# Patient Record
Sex: Female | Born: 1990 | Race: Black or African American | Hispanic: No | Marital: Single | Smoking: Former smoker
Health system: Southern US, Community
[De-identification: ages and names within clinical notes are randomized; demographics above are authoritative.]

## PROBLEM LIST (undated history)

## (undated) ENCOUNTER — Inpatient Hospital Stay (HOSPITAL_COMMUNITY): Payer: Self-pay

## (undated) DIAGNOSIS — D649 Anemia, unspecified: Secondary | ICD-10-CM

## (undated) DIAGNOSIS — F329 Major depressive disorder, single episode, unspecified: Secondary | ICD-10-CM

## (undated) DIAGNOSIS — F32A Depression, unspecified: Secondary | ICD-10-CM

## (undated) DIAGNOSIS — O139 Gestational [pregnancy-induced] hypertension without significant proteinuria, unspecified trimester: Secondary | ICD-10-CM

## (undated) DIAGNOSIS — F419 Anxiety disorder, unspecified: Secondary | ICD-10-CM

## (undated) HISTORY — DX: Anxiety disorder, unspecified: F41.9

## (undated) HISTORY — DX: Depression, unspecified: F32.A

## (undated) HISTORY — DX: Major depressive disorder, single episode, unspecified: F32.9

## (undated) HISTORY — PX: WISDOM TOOTH EXTRACTION: SHX21

---
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
## 2009-04-21 ENCOUNTER — Emergency Department (HOSPITAL_COMMUNITY): Admission: EM | Admit: 2009-04-21 | Discharge: 2009-04-21 | Payer: Self-pay | Admitting: Emergency Medicine

## 2009-04-22 ENCOUNTER — Emergency Department (HOSPITAL_COMMUNITY): Admission: EM | Admit: 2009-04-22 | Discharge: 2009-04-23 | Payer: Self-pay | Admitting: Emergency Medicine

## 2009-07-07 ENCOUNTER — Emergency Department (HOSPITAL_COMMUNITY): Admission: EM | Admit: 2009-07-07 | Discharge: 2009-07-07 | Payer: Self-pay | Admitting: Emergency Medicine

## 2009-07-10 ENCOUNTER — Other Ambulatory Visit (HOSPITAL_COMMUNITY): Admission: RE | Admit: 2009-07-10 | Discharge: 2009-07-16 | Payer: Self-pay | Admitting: Psychiatry

## 2009-07-10 ENCOUNTER — Ambulatory Visit: Payer: Self-pay | Admitting: Psychiatry

## 2010-09-19 ENCOUNTER — Emergency Department (HOSPITAL_COMMUNITY)
Admission: EM | Admit: 2010-09-19 | Discharge: 2010-09-19 | Disposition: A | Discharge status: Home / Self Care | Payer: Self-pay | Attending: Emergency Medicine | Admitting: Emergency Medicine

## 2010-09-19 ENCOUNTER — Emergency Department (HOSPITAL_COMMUNITY): Payer: Self-pay

## 2010-09-19 DIAGNOSIS — R071 Chest pain on breathing: Secondary | ICD-10-CM | POA: Insufficient documentation

## 2010-09-19 DIAGNOSIS — R0602 Shortness of breath: Secondary | ICD-10-CM | POA: Insufficient documentation

## 2010-09-19 DIAGNOSIS — N644 Mastodynia: Secondary | ICD-10-CM | POA: Insufficient documentation

## 2010-09-19 LAB — DIFFERENTIAL
Eosinophils Absolute: 0.1 10*3/uL (ref 0.0–0.7)
Eosinophils Relative: 1 % (ref 0–5)
Lymphocytes Relative: 26 % (ref 12–46)
Lymphs Abs: 1.7 10*3/uL (ref 0.7–4.0)
Monocytes Relative: 5 % (ref 3–12)

## 2010-09-19 LAB — CBC
HCT: 35.8 % — ABNORMAL LOW (ref 36.0–46.0)
MCH: 31.4 pg (ref 26.0–34.0)
MCV: 91.3 fL (ref 78.0–100.0)
Platelets: 200 10*3/uL (ref 150–400)
RBC: 3.92 MIL/uL (ref 3.87–5.11)
RDW: 12.8 % (ref 11.5–15.5)
WBC: 6.4 10*3/uL (ref 4.0–10.5)

## 2010-09-19 LAB — POCT I-STAT, CHEM 8
BUN: 7 mg/dL (ref 6–23)
Calcium, Ion: 1.26 mmol/L (ref 1.12–1.32)
Chloride: 103 mEq/L (ref 96–112)
Glucose, Bld: 87 mg/dL (ref 70–99)
TCO2: 22 mmol/L (ref 0–100)

## 2010-10-23 ENCOUNTER — Inpatient Hospital Stay (HOSPITAL_COMMUNITY)
Admission: RE | Admit: 2010-10-23 | Discharge: 2010-10-23 | Disposition: A | Discharge status: Home / Self Care | Payer: Self-pay | Source: Ambulatory Visit | Attending: Family Medicine | Admitting: Family Medicine

## 2010-10-24 ENCOUNTER — Encounter (HOSPITAL_COMMUNITY): Payer: Self-pay | Admitting: Radiology

## 2010-10-24 ENCOUNTER — Inpatient Hospital Stay (HOSPITAL_COMMUNITY)
Admission: AD | Admit: 2010-10-24 | Discharge: 2010-10-24 | Disposition: A | Discharge status: Home / Self Care | Payer: Medicaid Other | Source: Ambulatory Visit | Attending: Obstetrics & Gynecology | Admitting: Obstetrics & Gynecology

## 2010-10-24 ENCOUNTER — Inpatient Hospital Stay (HOSPITAL_COMMUNITY): Payer: Medicaid Other

## 2010-10-24 DIAGNOSIS — O209 Hemorrhage in early pregnancy, unspecified: Secondary | ICD-10-CM | POA: Insufficient documentation

## 2010-10-24 LAB — CBC
Platelets: 203 10*3/uL (ref 150–400)
RBC: 3.55 MIL/uL — ABNORMAL LOW (ref 3.87–5.11)
RDW: 13 % (ref 11.5–15.5)
WBC: 6.4 10*3/uL (ref 4.0–10.5)

## 2010-10-24 LAB — URINALYSIS, ROUTINE W REFLEX MICROSCOPIC
Bilirubin Urine: NEGATIVE
Glucose, UA: NEGATIVE mg/dL
Hgb urine dipstick: NEGATIVE
Specific Gravity, Urine: >1.03 — ABNORMAL HIGH (ref 1.005–1.030)

## 2010-10-24 LAB — ABO/RH: ABO/RH(D): A POS

## 2010-10-24 LAB — WET PREP, GENITAL
Clue Cells Wet Prep HPF POC: NONE SEEN
Trich, Wet Prep: NONE SEEN
Yeast Wet Prep HPF POC: NONE SEEN

## 2010-10-24 LAB — HCG, QUANTITATIVE, PREGNANCY

## 2010-10-25 LAB — GC/CHLAMYDIA PROBE AMP, GENITAL

## 2010-10-28 ENCOUNTER — Inpatient Hospital Stay (HOSPITAL_COMMUNITY): Payer: Medicaid Other

## 2010-10-28 ENCOUNTER — Inpatient Hospital Stay (HOSPITAL_COMMUNITY)
Admission: AD | Admit: 2010-10-28 | Discharge: 2010-10-28 | Disposition: A | Discharge status: Home / Self Care | Payer: Medicaid Other | Source: Ambulatory Visit | Attending: Obstetrics & Gynecology | Admitting: Obstetrics & Gynecology

## 2010-10-28 DIAGNOSIS — O209 Hemorrhage in early pregnancy, unspecified: Secondary | ICD-10-CM

## 2010-10-28 LAB — URINALYSIS, ROUTINE W REFLEX MICROSCOPIC
Bilirubin Urine: NEGATIVE
Hgb urine dipstick: NEGATIVE
Ketones, ur: NEGATIVE mg/dL
Protein, ur: NEGATIVE mg/dL
Specific Gravity, Urine: 1.02 (ref 1.005–1.030)
Urobilinogen, UA: 0.2 mg/dL (ref 0.0–1.0)

## 2010-10-28 LAB — RAPID URINE DRUG SCREEN, HOSP PERFORMED: Tetrahydrocannabinol: NOT DETECTED

## 2010-11-04 LAB — COMPREHENSIVE METABOLIC PANEL
ALT: 8 U/L (ref 0–35)
AST: 17 U/L (ref 0–37)
Albumin: 4.2 g/dL (ref 3.5–5.2)
CO2: 25 mEq/L (ref 19–32)
Chloride: 104 mEq/L (ref 96–112)
GFR calc Af Amer: >60 mL/min (ref >60)
GFR calc non Af Amer: >60 mL/min (ref >60)
Sodium: 137 mEq/L (ref 135–145)
Total Bilirubin: 1.2 mg/dL (ref 0.3–1.2)

## 2010-11-04 LAB — ACETAMINOPHEN LEVEL
Acetaminophen (Tylenol), Serum: <10 ug/mL — ABNORMAL LOW (ref 10–30)
Acetaminophen (Tylenol), Serum: <10 ug/mL — ABNORMAL LOW (ref 10–30)

## 2010-11-04 LAB — URINALYSIS, ROUTINE W REFLEX MICROSCOPIC
Glucose, UA: NEGATIVE mg/dL
Ketones, ur: NEGATIVE mg/dL
Leukocytes, UA: NEGATIVE
Nitrite: NEGATIVE
Specific Gravity, Urine: 1.036 — ABNORMAL HIGH (ref 1.005–1.030)
pH: 6 (ref 5.0–8.0)

## 2010-11-04 LAB — RAPID URINE DRUG SCREEN, HOSP PERFORMED
Cocaine: NOT DETECTED
Tetrahydrocannabinol: POSITIVE — AB

## 2010-11-04 LAB — URINE MICROSCOPIC-ADD ON

## 2010-11-04 LAB — DIFFERENTIAL
Basophils Absolute: 0.1 10*3/uL (ref 0.0–0.1)
Eosinophils Absolute: 0.1 10*3/uL (ref 0.0–0.7)
Eosinophils Relative: 1 % (ref 0–5)
Lymphs Abs: 1.9 10*3/uL (ref 0.7–4.0)
Monocytes Absolute: 0.2 10*3/uL (ref 0.1–1.0)

## 2010-11-04 LAB — CBC
RBC: 4.18 MIL/uL (ref 3.87–5.11)
WBC: 3.9 10*3/uL — ABNORMAL LOW (ref 4.0–10.5)

## 2010-11-04 LAB — PROTIME-INR: Prothrombin Time: 14.4 seconds (ref 11.6–15.2)

## 2010-11-04 LAB — ETHANOL: Alcohol, Ethyl (B): <5 mg/dL (ref 0–10)

## 2010-11-07 LAB — HEPATITIS PANEL, ACUTE: Hepatitis B Surface Ag: NEGATIVE

## 2010-11-07 LAB — URINALYSIS, ROUTINE W REFLEX MICROSCOPIC
Hgb urine dipstick: NEGATIVE
Leukocytes, UA: NEGATIVE
Nitrite: NEGATIVE
Protein, ur: 30 mg/dL — AB
Protein, ur: NEGATIVE mg/dL
Specific Gravity, Urine: 1.025 (ref 1.005–1.030)
Specific Gravity, Urine: 1.031 — ABNORMAL HIGH (ref 1.005–1.030)
Urobilinogen, UA: 0.2 mg/dL (ref 0.0–1.0)
Urobilinogen, UA: 1 mg/dL (ref 0.0–1.0)

## 2010-11-07 LAB — COMPREHENSIVE METABOLIC PANEL
ALT: 13 U/L (ref 0–35)
AST: 27 U/L (ref 0–37)
Albumin: 4.1 g/dL (ref 3.5–5.2)
Albumin: 4.6 g/dL (ref 3.5–5.2)
Alkaline Phosphatase: 41 U/L (ref 39–117)
Alkaline Phosphatase: 47 U/L (ref 39–117)
BUN: 12 mg/dL (ref 6–23)
CO2: 24 mEq/L (ref 19–32)
Chloride: 103 mEq/L (ref 96–112)
Creatinine, Ser: 0.67 mg/dL (ref 0.4–1.2)
GFR calc Af Amer: >60 mL/min (ref >60)
Glucose, Bld: 124 mg/dL — ABNORMAL HIGH (ref 70–99)
Potassium: 3.5 mEq/L (ref 3.5–5.1)
Potassium: 4 mEq/L (ref 3.5–5.1)
Sodium: 138 mEq/L (ref 135–145)
Total Bilirubin: 1.3 mg/dL — ABNORMAL HIGH (ref 0.3–1.2)
Total Bilirubin: 1.7 mg/dL — ABNORMAL HIGH (ref 0.3–1.2)
Total Protein: 8.3 g/dL (ref 6.0–8.3)

## 2010-11-07 LAB — DIFFERENTIAL
Basophils Absolute: 0 10*3/uL (ref 0.0–0.1)
Basophils Absolute: 0 10*3/uL (ref 0.0–0.1)
Basophils Relative: 0 % (ref 0–1)
Lymphocytes Relative: 11 % — ABNORMAL LOW (ref 12–46)
Lymphs Abs: 0.2 10*3/uL — ABNORMAL LOW (ref 0.7–4.0)
Monocytes Absolute: 0.6 10*3/uL (ref 0.1–1.0)
Monocytes Relative: 2 % — ABNORMAL LOW (ref 3–12)
Monocytes Relative: 3 % (ref 3–12)
Neutro Abs: 19.3 10*3/uL — ABNORMAL HIGH (ref 1.7–7.7)
Neutro Abs: 7.3 10*3/uL (ref 1.7–7.7)
Neutrophils Relative %: 87 % — ABNORMAL HIGH (ref 43–77)

## 2010-11-07 LAB — PREGNANCY, URINE: Preg Test, Ur: NEGATIVE

## 2010-11-07 LAB — URINE MICROSCOPIC-ADD ON

## 2010-11-07 LAB — CBC
HCT: 40.2 % (ref 36.0–46.0)
Hemoglobin: 13.8 g/dL (ref 12.0–15.0)
MCV: 92.5 fL (ref 78.0–100.0)
Platelets: 146 10*3/uL — ABNORMAL LOW (ref 150–400)
Platelets: 210 10*3/uL (ref 150–400)
RBC: 4.27 MIL/uL (ref 3.87–5.11)
RDW: 13 % (ref 11.5–15.5)
WBC: 20.1 10*3/uL — ABNORMAL HIGH (ref 4.0–10.5)

## 2010-11-13 LAB — POCT I-STAT, CHEM 8
BUN: 11 mg/dL (ref 6–23)
Creatinine, Ser: 0.9 mg/dL (ref 0.4–1.2)
Potassium: 3.5 mEq/L (ref 3.5–5.1)
Sodium: 140 mEq/L (ref 135–145)

## 2010-11-18 LAB — COMPREHENSIVE METABOLIC PANEL
ALT: 12 U/L (ref 0–35)
CO2: 25 mEq/L (ref 19–32)
Calcium: 9.9 mg/dL (ref 8.4–10.5)
Chloride: 102 mEq/L (ref 96–112)
Creatinine, Ser: 0.71 mg/dL (ref 0.4–1.2)
GFR calc non Af Amer: >60 mL/min (ref >60)
Glucose, Bld: 79 mg/dL (ref 70–99)
Sodium: 134 mEq/L — ABNORMAL LOW (ref 135–145)
Total Bilirubin: 1 mg/dL (ref 0.3–1.2)

## 2010-11-18 LAB — URINALYSIS, ROUTINE W REFLEX MICROSCOPIC
Bilirubin Urine: NEGATIVE
Ketones, ur: 15 mg/dL — AB
Nitrite: NEGATIVE
Protein, ur: NEGATIVE mg/dL
Urobilinogen, UA: 1 mg/dL (ref 0.0–1.0)
pH: 6 (ref 5.0–8.0)

## 2010-11-18 LAB — DIFFERENTIAL
Basophils Absolute: 0 10*3/uL (ref 0.0–0.1)
Eosinophils Absolute: 0 10*3/uL (ref 0.0–0.7)
Lymphs Abs: 1.9 10*3/uL (ref 0.7–4.0)
Neutrophils Relative %: 66 % (ref 43–77)

## 2010-11-18 LAB — WET PREP, GENITAL: WBC, Wet Prep HPF POC: NONE SEEN

## 2010-11-18 LAB — CBC
Hemoglobin: 14.5 g/dL (ref 12.0–15.0)
MCHC: 33.5 g/dL (ref 30.0–36.0)
MCV: 92.8 fL (ref 78.0–100.0)
RBC: 4.66 MIL/uL (ref 3.87–5.11)

## 2010-11-18 LAB — RPR: RPR Ser Ql: NONREACTIVE

## 2010-11-18 LAB — PREGNANCY, URINE: Preg Test, Ur: NEGATIVE

## 2011-02-02 ENCOUNTER — Inpatient Hospital Stay (HOSPITAL_COMMUNITY)
Admission: AD | Admit: 2011-02-02 | Discharge: 2011-02-02 | Disposition: A | Discharge status: Home / Self Care | Payer: Medicaid Other | Source: Ambulatory Visit | Attending: Obstetrics | Admitting: Obstetrics

## 2011-02-02 DIAGNOSIS — O4700 False labor before 37 completed weeks of gestation, unspecified trimester: Secondary | ICD-10-CM | POA: Insufficient documentation

## 2011-02-02 LAB — URINALYSIS, ROUTINE W REFLEX MICROSCOPIC
Leukocytes, UA: NEGATIVE
Nitrite: NEGATIVE
Specific Gravity, Urine: 1.02 (ref 1.005–1.030)
Urobilinogen, UA: 1 mg/dL (ref 0.0–1.0)
pH: 7.5 (ref 5.0–8.0)

## 2011-02-05 LAB — RUBELLA ANTIBODY, IGM: Rubella: IMMUNE

## 2011-02-05 LAB — ABO/RH: RH Type: POSITIVE

## 2011-02-05 LAB — HIV ANTIBODY (ROUTINE TESTING W REFLEX): HIV: NONREACTIVE

## 2011-02-26 ENCOUNTER — Inpatient Hospital Stay (HOSPITAL_COMMUNITY)
Admission: AD | Admit: 2011-02-26 | Discharge: 2011-02-26 | Disposition: A | Discharge status: Home / Self Care | Payer: Medicaid Other | Source: Ambulatory Visit | Attending: Obstetrics | Admitting: Obstetrics

## 2011-02-26 ENCOUNTER — Encounter (HOSPITAL_COMMUNITY): Payer: Self-pay

## 2011-02-26 DIAGNOSIS — O479 False labor, unspecified: Secondary | ICD-10-CM | POA: Insufficient documentation

## 2011-02-26 NOTE — Progress Notes (Signed)
Pt states she is having contractions every 5-6 minutes. Reports good fetal movement, no leaking or bleeding.

## 2011-02-26 NOTE — Progress Notes (Signed)
uc's since 2300, denies bleeding or LOF.  Pt was seen in MD office yesterday but her cervix was not checked.

## 2011-02-28 ENCOUNTER — Inpatient Hospital Stay (HOSPITAL_COMMUNITY)
Admission: AD | Admit: 2011-02-28 | Discharge: 2011-03-01 | Disposition: A | Discharge status: Home / Self Care | Payer: Medicaid Other | Source: Ambulatory Visit | Attending: Obstetrics & Gynecology | Admitting: Obstetrics & Gynecology

## 2011-02-28 ENCOUNTER — Encounter (HOSPITAL_COMMUNITY): Payer: Self-pay

## 2011-02-28 DIAGNOSIS — B9689 Other specified bacterial agents as the cause of diseases classified elsewhere: Secondary | ICD-10-CM | POA: Insufficient documentation

## 2011-02-28 DIAGNOSIS — Z3400 Encounter for supervision of normal first pregnancy, unspecified trimester: Secondary | ICD-10-CM

## 2011-02-28 DIAGNOSIS — N760 Acute vaginitis: Secondary | ICD-10-CM | POA: Insufficient documentation

## 2011-02-28 DIAGNOSIS — O2390 Unspecified genitourinary tract infection in pregnancy, unspecified trimester: Secondary | ICD-10-CM | POA: Insufficient documentation

## 2011-02-28 DIAGNOSIS — A499 Bacterial infection, unspecified: Secondary | ICD-10-CM | POA: Insufficient documentation

## 2011-02-28 DIAGNOSIS — O479 False labor, unspecified: Secondary | ICD-10-CM

## 2011-02-28 NOTE — Progress Notes (Signed)
Patient is here with c/o leaking amniotic fluids for 2 days. She states the fluids has an odor today and she had to wear a pantiliner. She reports good fetal movement and lower abdominal pressure. Denies any vaginal bleeding or contractions.

## 2011-02-28 NOTE — Progress Notes (Signed)
Pt states, " For the past two days I have had trickles for water coming out and pressure at the bottom. It is enough that it comes thru my clothes."

## 2011-03-01 LAB — WET PREP, GENITAL: Trich, Wet Prep: NONE SEEN

## 2011-03-01 MED ORDER — METRONIDAZOLE 500 MG PO TABS: 500.0000 mg | ORAL_TABLET | Freq: Two times a day (BID) | ORAL | Status: AC

## 2011-03-01 NOTE — ED Provider Notes (Signed)
Amnisure negative Microscopic wet-mount exam shows clue cells, white blood cells  Assessment: 1. Intact membranes 2. BV  Plan: 1. D/C home 2. Rx Flagyl 500 mg PO BID x 7 days

## 2011-03-01 NOTE — ED Provider Notes (Signed)
Cheyenne Hahn is a 20 y.o. female at 38.4 weeks presenting for possible LOF x 2 days. She reports several episodes of feeling dampness in her underwear, lately enough to wear a panty-liner. She describes the discharge as watery, but cannot tell if it is clear or white. She reports + FM and denies VB or significant contractions. History OB History    Grav Para Term Preterm Abortions TAB SAB Ect Mult Living   1              Past Medical History  Diagnosis Date  . No pertinent past medical history    Past Surgical History  Procedure Date  . Wisdom tooth extraction    Family History: family history is not on file. Social History:  reports that she has never smoked. She does not have any smokeless tobacco history on file. She reports that she does not drink alcohol or use illicit drugs.  ROS  Dilation: 1 Effacement (%): 50 Station: -2 Blood pressure 113/77, pulse 101, temperature 98.9 F (37.2 C), temperature source Oral, resp. rate 18, height 5' 2.25" (1.581 m), weight 65.488 kg (144 lb 6 oz).  Maternal Exam:  Introitus: Vagina is positive for vaginal discharge (Mod amount of thin, white, malodorous discharge).    Physical Exam  Constitutional: She is oriented to person, place, and time. She appears well-developed and well-nourished. No distress.  GI: Soft. There is no tenderness.  Genitourinary: Uterus normal. Uterus is not tender. No tenderness or bleeding around the vagina. Vaginal discharge (Mod amount of thin, white, malodorous discharge) found.  Neurological: She is alert and oriented to person, place, and time.   FHR: 130's category I  Toco: irreg, mild UC's Prenatal labs: ABO, Rh: A POS (03/23 1900) Antibody: Negative (07/05 0000) Rubella:   RPR:    HBsAg: Negative (07/05 0000)  HIV: Non-reactive (07/05 0000)  GBS: Positive (07/06 0000)   Assessment/Plan: Assessment: 1. Possible LOF 2. FHR category I 3. Fern neg  Plan: 1. Send amnisure per consult w/ Dr.  Tamela Oddi 2. Wet prep 3. D/C home if [facility], Undrea Shipes 03/01/2011, 1:30 AM

## 2011-03-11 ENCOUNTER — Other Ambulatory Visit (HOSPITAL_COMMUNITY): Payer: Self-pay | Admitting: Obstetrics

## 2011-03-11 ENCOUNTER — Inpatient Hospital Stay (HOSPITAL_COMMUNITY)
Admission: AD | Admit: 2011-03-11 | Discharge: 2011-03-14 | DRG: 775 | Disposition: A | Discharge status: Home / Self Care | Payer: Medicaid Other | Source: Ambulatory Visit | Attending: Obstetrics | Admitting: Obstetrics

## 2011-03-11 DIAGNOSIS — Z22330 Carrier of Group B streptococcus: Secondary | ICD-10-CM

## 2011-03-11 DIAGNOSIS — O99892 Other specified diseases and conditions complicating childbirth: Secondary | ICD-10-CM | POA: Diagnosis present

## 2011-03-11 DIAGNOSIS — Z3490 Encounter for supervision of normal pregnancy, unspecified, unspecified trimester: Secondary | ICD-10-CM

## 2011-03-11 DIAGNOSIS — O368190 Decreased fetal movements, unspecified trimester, not applicable or unspecified: Secondary | ICD-10-CM

## 2011-03-11 LAB — CBC
MCHC: 33.1 g/dL (ref 30.0–36.0)
Platelets: 175 10*3/uL (ref 150–400)
RDW: 13.4 % (ref 11.5–15.5)
WBC: 7.4 10*3/uL (ref 4.0–10.5)

## 2011-03-11 MED ORDER — LACTATED RINGERS IV SOLN
500.0000 mL | INTRAVENOUS | Status: DC | PRN
  Administered 2011-03-12: 500 mL via INTRAVENOUS

## 2011-03-11 MED ORDER — LACTATED RINGERS IV SOLN
INTRAVENOUS | Status: DC
  Administered 2011-03-11: 14:00:00 via INTRAVENOUS
  Administered 2011-03-11 – 2011-03-12 (×2): 1000 mL via INTRAVENOUS

## 2011-03-11 MED ORDER — IBUPROFEN 600 MG PO TABS
600.0000 mg | ORAL_TABLET | Freq: Four times a day (QID) | ORAL | Status: DC | PRN
  Administered 2011-03-12 (×2): 600 mg via ORAL
  Filled 2011-03-11 (×2): qty 1

## 2011-03-11 MED ORDER — TERBUTALINE SULFATE 1 MG/ML IJ SOLN: 0.2500 mg | Freq: Once | INTRAMUSCULAR | Status: AC | PRN

## 2011-03-11 MED ORDER — NALBUPHINE SYRINGE 5 MG/0.5 ML
10.0000 mg | INJECTION | INTRAMUSCULAR | Status: DC | PRN
  Administered 2011-03-11 (×2): 10 mg via INTRAVENOUS
  Filled 2011-03-11 (×3): qty 1

## 2011-03-11 MED ORDER — FLEET ENEMA 7-19 GM/118ML RE ENEM: 1.0000 | ENEMA | RECTAL | Status: DC | PRN

## 2011-03-11 MED ORDER — CITRIC ACID-SODIUM CITRATE 334-500 MG/5ML PO SOLN: 30.0000 mL | ORAL | Status: DC | PRN

## 2011-03-11 MED ORDER — LIDOCAINE HCL (PF) 1 % IJ SOLN
30.0000 mL | INTRAMUSCULAR | Status: DC | PRN
  Filled 2011-03-11 (×2): qty 30

## 2011-03-11 MED ORDER — HYDROXYZINE HCL 50 MG/ML IM SOLN
50.0000 mg | Freq: Four times a day (QID) | INTRAMUSCULAR | Status: DC | PRN
  Administered 2011-03-11: 50 mg via INTRAMUSCULAR
  Filled 2011-03-11 (×2): qty 1

## 2011-03-11 MED ORDER — ONDANSETRON HCL 4 MG/2ML IJ SOLN: 4.0000 mg | Freq: Four times a day (QID) | INTRAMUSCULAR | Status: DC | PRN

## 2011-03-11 MED ORDER — ACETAMINOPHEN 325 MG PO TABS
650.0000 mg | ORAL_TABLET | ORAL | Status: DC | PRN
  Administered 2011-03-12: 650 mg via ORAL
  Filled 2011-03-11: qty 2

## 2011-03-11 MED ORDER — NALBUPHINE SYRINGE 5 MG/0.5 ML
10.0000 mg | INJECTION | Freq: Four times a day (QID) | INTRAMUSCULAR | Status: DC | PRN
  Administered 2011-03-11: 10 mg via INTRAMUSCULAR
  Filled 2011-03-11 (×2): qty 1

## 2011-03-11 MED ORDER — OXYCODONE-ACETAMINOPHEN 5-325 MG PO TABS
2.0000 | ORAL_TABLET | ORAL | Status: DC | PRN
  Administered 2011-03-12: 1 via ORAL
  Filled 2011-03-11: qty 1

## 2011-03-11 MED ORDER — DINOPROSTONE 10 MG VA INST
10.0000 mg | VAGINAL_INSERT | Freq: Once | VAGINAL | Status: AC
  Administered 2011-03-11: 10 mg via VAGINAL
  Filled 2011-03-11: qty 1

## 2011-03-11 MED ORDER — OXYTOCIN 20 UNITS IN LACTATED RINGERS INFUSION - SIMPLE
125.0000 mL/h | Freq: Once | INTRAVENOUS | Status: DC
  Administered 2011-03-12: 999 mL/h via INTRAVENOUS
  Filled 2011-03-11: qty 1000

## 2011-03-11 NOTE — H&P (Signed)
Cheyenne Hahn is a 20 y.o. female presenting for prenatal visit.. Maternal Medical History:  Reason for admission: Decreased fetal movement at 40 + weeks.  Nonreactive NST.  Contractions: Frequency: regular.   Duration is approximately 1 minute.   Perceived severity is mild.    Fetal activity: Perceived fetal activity is decreased.   Last perceived fetal movement was within the past hour.    Prenatal Complications - Diabetes: none.    OB History    Grav Para Term Preterm Abortions TAB SAB Ect Mult Living   1              Past Medical History  Diagnosis Date  . No pertinent past medical history    Past Surgical History  Procedure Date  . Wisdom tooth extraction    Family History: family history is not on file. Social History:  reports that she has never smoked. She does not have any smokeless tobacco history on file. She reports that she does not drink alcohol or use illicit drugs.  Review of Systems  All other systems reviewed and are negative.    Dilation: 1 Effacement (%): 60 Station: -1 Exam by:: m wilkins rnc Blood pressure 110/53, pulse 90, temperature 98.4 F (36.9 C), temperature source Oral, resp. rate 18, height 5\' 3"  (1.6 m), weight 67.132 kg (148 lb). Maternal Exam:  Uterine Assessment: Contraction strength is mild.  Contraction duration is 1 minute. Contraction frequency is regular.   Abdomen: Patient reports no abdominal tenderness. Fetal presentation: vertex  Introitus: Normal vulva. Normal vagina.  Ferning test: not done.  Nitrazine test: not done. Amniotic fluid character: not assessed.  Pelvis: adequate for delivery.   Cervix: Cervix evaluated by digital exam.     Physical Exam  Nursing note and vitals reviewed. Constitutional: She is oriented to person, place, and time. She appears well-developed and well-nourished.  HENT:  Head: Normocephalic and atraumatic.  Eyes: Conjunctivae and EOM are normal. Pupils are equal, round, and reactive  to light.  Neck: Normal range of motion. Neck supple.  Cardiovascular: Normal rate and regular rhythm.   Respiratory: Effort normal.  GI: Soft.  Genitourinary: Vagina normal and uterus normal.  Musculoskeletal: Normal range of motion.  Neurological: She is alert and oriented to person, place, and time. She has normal reflexes.  Skin: Skin is warm and dry.  Psychiatric: She has a normal mood and affect. Her behavior is normal. Judgment and thought content normal.    Prenatal labs: ABO, Rh: A POS (03/23 1900) Antibody: Negative (07/05 0000) Rubella:   RPR:    HBsAg: Negative (07/05 0000)  HIV: Non-reactive (07/05 0000)  GBS: Positive (07/06 0000)   Assessment/Plan: 40 + weeks.  Decreased fetal movement.  Nonreactive NST.  Admit for IOL.   HARPER,CHARLES A 03/11/2011, 6:05 PM

## 2011-03-12 ENCOUNTER — Encounter (HOSPITAL_COMMUNITY): Payer: Self-pay | Admitting: Anesthesiology

## 2011-03-12 ENCOUNTER — Inpatient Hospital Stay (HOSPITAL_COMMUNITY): Payer: Medicaid Other | Admitting: Anesthesiology

## 2011-03-12 ENCOUNTER — Encounter (HOSPITAL_COMMUNITY): Payer: Self-pay | Admitting: *Deleted

## 2011-03-12 MED ORDER — PHENYLEPHRINE 40 MCG/ML (10ML) SYRINGE FOR IV PUSH (FOR BLOOD PRESSURE SUPPORT)
80.0000 ug | PREFILLED_SYRINGE | INTRAVENOUS | Status: DC | PRN
  Filled 2011-03-12 (×2): qty 5

## 2011-03-12 MED ORDER — DIPHENHYDRAMINE HCL 50 MG/ML IJ SOLN: 12.5000 mg | INTRAMUSCULAR | Status: DC | PRN

## 2011-03-12 MED ORDER — LIDOCAINE HCL 1.5 % IJ SOLN
INTRAMUSCULAR | Status: DC | PRN
  Administered 2011-03-12 (×2): 5 mL via EPIDURAL
  Administered 2011-03-12: 2 mL via EPIDURAL

## 2011-03-12 MED ORDER — PENICILLIN G POTASSIUM 5000000 UNITS IJ SOLR
2.5000 10*6.[IU] | INTRAVENOUS | Status: DC
  Administered 2011-03-12: 2.5 10*6.[IU] via INTRAVENOUS
  Filled 2011-03-12 (×9): qty 2.5

## 2011-03-12 MED ORDER — PHENYLEPHRINE 40 MCG/ML (10ML) SYRINGE FOR IV PUSH (FOR BLOOD PRESSURE SUPPORT)
80.0000 ug | PREFILLED_SYRINGE | INTRAVENOUS | Status: DC | PRN
  Filled 2011-03-12: qty 5

## 2011-03-12 MED ORDER — PENICILLIN G POTASSIUM 5000000 UNITS IJ SOLR
5.0000 10*6.[IU] | Freq: Once | INTRAVENOUS | Status: DC
  Administered 2011-03-12: 5 10*6.[IU] via INTRAVENOUS
  Filled 2011-03-12: qty 5

## 2011-03-12 MED ORDER — OXYTOCIN 10 UNIT/ML IJ SOLN
INTRAMUSCULAR | Status: AC
  Administered 2011-03-12: 20 [IU] via INTRAVENOUS
  Filled 2011-03-12: qty 2

## 2011-03-12 MED ORDER — OXYTOCIN 20 UNITS IN LACTATED RINGERS INFUSION - SIMPLE
INTRAVENOUS | Status: AC
  Filled 2011-03-12: qty 1000

## 2011-03-12 MED ORDER — EPHEDRINE 5 MG/ML INJ
10.0000 mg | INTRAVENOUS | Status: DC | PRN
  Filled 2011-03-12 (×2): qty 4

## 2011-03-12 MED ORDER — FENTANYL 2.5 MCG/ML BUPIVACAINE 1/10 % EPIDURAL INFUSION (WH - ANES)
14.0000 mL/h | INTRAMUSCULAR | Status: DC
  Administered 2011-03-12 (×3): 14 mL/h via EPIDURAL
  Filled 2011-03-12 (×3): qty 60

## 2011-03-12 MED ORDER — LACTATED RINGERS IV SOLN: 500.0000 mL | Freq: Once | INTRAVENOUS | Status: DC

## 2011-03-12 MED ORDER — EPHEDRINE 5 MG/ML INJ
10.0000 mg | INTRAVENOUS | Status: DC | PRN
  Filled 2011-03-12: qty 4

## 2011-03-12 NOTE — Anesthesia Procedure Notes (Signed)
Epidural Patient location during procedure: OB Start time: 03/12/2011 12:23 AM Reason for block: procedure for pain  Staffing Performed by: anesthesiologist   Preanesthetic Checklist Completed: patient identified, site marked, surgical consent, pre-op evaluation, timeout performed, IV checked, risks and benefits discussed and monitors and equipment checked  Epidural Patient position: sitting Prep: site prepped and draped and DuraPrep Patient monitoring: continuous pulse ox and blood pressure Approach: midline Injection technique: LOR air  Needle:  Needle type: Tuohy  Needle gauge: 17 G Needle length: 9 cm Needle insertion depth: 5 cm cm Catheter type: closed end flexible Catheter size: 19 Gauge Catheter at skin depth: 10 cm Test dose: negative  Assessment Events: blood not aspirated, injection not painful, no injection resistance, negative IV test and no paresthesia  Additional Notes Discussed risk of headache, infection, bleeding, nerve injury and failed or incomplete block.  Patient voices understanding and wishes to proceed.

## 2011-03-12 NOTE — Progress Notes (Signed)
UR Chart review completed.  

## 2011-03-12 NOTE — Progress Notes (Signed)
Cheyenne Hahn is a 20 y.o. G1P0000 at [redacted]w[redacted]d by LMP admitted for induction of labor due to Non-reactive NST.  Subjective:   Objective: BP 125/72  Pulse 106  Temp(Src) 98.9 F (37.2 C) (Oral)  Resp 20  Ht 5\' 3"  (1.6 m)  Wt 67.132 kg (148 lb)  BMI 26.22 kg/m2  SpO2 100% I/O last 3 completed shifts: In: -  Out: 850 [Urine:850] I/O this shift: In: -  Out: 200 [Urine:200]  FHT:  FHR: 150 bpm, variability: moderate,  accelerations:  Present,  decelerations:  Absent UC:   regular, every 3 minutes SVE:   Dilation: 10 Effacement (%): 100 Station: +2 Exam by:: Enis Slipper, rn  Labs: Lab Results  Component Value Date   WBC 7.4 03/11/2011   HGB 10.7* 03/11/2011   HCT 32.3* 03/11/2011   MCV 87.5 03/11/2011   PLT 175 03/11/2011    Assessment / Plan: Induction of labor due to non-reassuring fetal testing,  progressing well on pitocin  Labor: Progressing on Pitocin, will continue to increase then AROM Preeclampsia:  n/a Fetal Wellbeing:  Category II Pain Control:  Epidural I/D:  n/a Anticipated MOD:  NSVD  Ata Pecha A 03/12/2011, 9:40 AM

## 2011-03-12 NOTE — Anesthesia Preprocedure Evaluation (Signed)
Anesthesia Evaluation  Name, MR# and DOB Patient awake  General Assessment Comment  Reviewed: Allergy & Precautions, H&P , NPO status , Patient's Chart, lab work & pertinent test results, reviewed documented beta blocker date and time   History of Anesthesia Complications Negative for: history of anesthetic complications  Airway Mallampati: I TM Distance: >3 FB Neck ROM: full    Dental  (+) Teeth Intact   Pulmonary  clear to auscultation  breath sounds clear to auscultation none    Cardiovascular regular Normal    Neuro/Psych Negative Neurological ROS  Negative Psych ROS  GI/Hepatic/Renal negative GI ROS, negative Liver ROS, and negative Renal ROS (+)       Endo/Other  Negative Endocrine ROS (+)      Abdominal   Musculoskeletal   Hematology negative hematology ROS (+)   Peds  Reproductive/Obstetrics (+) Pregnancy    Anesthesia Other Findings             Anesthesia Physical Anesthesia Plan  ASA: II  Anesthesia Plan: Epidural   Post-op Pain Management:    Induction:   Airway Management Planned:   Additional Equipment:   Intra-op Plan:   Post-operative Plan:   Informed Consent: I have reviewed the patients History and Physical, chart, labs and discussed the procedure including the risks, benefits and alternatives for the proposed anesthesia with the patient or authorized representative who has indicated his/her understanding and acceptance.     Plan Discussed with:   Anesthesia Plan Comments:         Anesthesia Quick Evaluation

## 2011-03-12 NOTE — Progress Notes (Signed)
Cheyenne Hahn is a 20 y.o. G1P0 at [redacted]w[redacted]d by LMP admitted for induction of labor due to Non-reactive NST.  Subjective:   Objective: BP 120/67  Pulse 82  Temp(Src) 98.1 F (36.7 C) (Oral)  Resp 18  Ht 5\' 3"  (1.6 m)  Wt 67.132 kg (148 lb)  BMI 26.22 kg/m2  SpO2 100%      FHT:  FHR: 150 bpm, variability: minimal ,  accelerations:  Present,  decelerations:  Present variables. UC:   regular, every 3 minutes SVE:   Dilation: 4 Effacement (%): 90 Station: -1 Exam by:: Cheyenne Singer RN  Labs: Lab Results  Component Value Date   WBC 7.4 03/11/2011   HGB 10.7* 03/11/2011   HCT 32.3* 03/11/2011   MCV 87.5 03/11/2011   PLT 175 03/11/2011    Assessment / Plan: Induction of labor due to non-reassuring fetal testing,  progressing well on pitocin  Labor: Progressing on Pitocin, will continue to increase then AROM Preeclampsia:  n/a Fetal Wellbeing:  Category II Pain Control:  Epidural I/D:  n/a Anticipated MOD:  NSVD  Cheyenne Hahn A 03/12/2011, 4:37 AM

## 2011-03-12 NOTE — Progress Notes (Signed)
Mom reports baby has latched to right breast, but not to left. Right nipple flat, Left nipple inverted. Hand expression demonstrated, colostrum present. Mom given shells and instructed to pre-pump to bring nipples out. Has started with bottles, advised not to give bottles yet, enc to offer breast every 2-3 hours or when baby demonstrates feeding ques. Discussed hand expression or pumping and giving EBM with medicine dropper if continues to supplement. Ask for assistance with latch. Lactation brochure reviewed with mom, advised of community resources for breastfeeding mom, advised of outpatient services available.  Did no observe or attempt latch, baby had bottle prior to visit.

## 2011-03-13 LAB — CBC
HCT: 28.1 % — ABNORMAL LOW (ref 36.0–46.0)
Hemoglobin: 9.1 g/dL — ABNORMAL LOW (ref 12.0–15.0)
MCH: 28.3 pg (ref 26.0–34.0)
MCHC: 32.4 g/dL (ref 30.0–36.0)
MCV: 87.5 fL (ref 78.0–100.0)
Platelets: 145 10*3/uL — ABNORMAL LOW (ref 150–400)
RBC: 3.21 MIL/uL — ABNORMAL LOW (ref 3.87–5.11)
RDW: 13.7 % (ref 11.5–15.5)
WBC: 11.3 10*3/uL — ABNORMAL HIGH (ref 4.0–10.5)

## 2011-03-13 MED ORDER — FERROUS SULFATE 325 (65 FE) MG PO TABS
325.0000 mg | ORAL_TABLET | Freq: Two times a day (BID) | ORAL | Status: DC
  Administered 2011-03-13 – 2011-03-14 (×3): 325 mg via ORAL
  Filled 2011-03-13 (×3): qty 1

## 2011-03-13 MED ORDER — DIPHENHYDRAMINE HCL 25 MG PO CAPS: 25.0000 mg | ORAL_CAPSULE | Freq: Four times a day (QID) | ORAL | Status: DC | PRN

## 2011-03-13 MED ORDER — OXYTOCIN 20 UNITS IN LACTATED RINGERS INFUSION - SIMPLE: 125.0000 mL/h | INTRAVENOUS | Status: DC | PRN

## 2011-03-13 MED ORDER — ONDANSETRON HCL 4 MG/2ML IJ SOLN: 4.0000 mg | INTRAMUSCULAR | Status: DC | PRN

## 2011-03-13 MED ORDER — LANOLIN HYDROUS EX OINT: TOPICAL_OINTMENT | CUTANEOUS | Status: DC | PRN

## 2011-03-13 MED ORDER — SENNOSIDES-DOCUSATE SODIUM 8.6-50 MG PO TABS
2.0000 | ORAL_TABLET | Freq: Every day | ORAL | Status: DC
  Administered 2011-03-13: 2 via ORAL

## 2011-03-13 MED ORDER — IBUPROFEN 600 MG PO TABS
600.0000 mg | ORAL_TABLET | Freq: Four times a day (QID) | ORAL | Status: DC
  Administered 2011-03-13 – 2011-03-14 (×6): 600 mg via ORAL
  Filled 2011-03-13 (×6): qty 1

## 2011-03-13 MED ORDER — BENZOCAINE-MENTHOL 20-0.5 % EX AERO: 1.0000 "application " | INHALATION_SPRAY | CUTANEOUS | Status: DC | PRN

## 2011-03-13 MED ORDER — SODIUM CHLORIDE 0.9 % IJ SOLN: 3.0000 mL | Freq: Two times a day (BID) | INTRAMUSCULAR | Status: DC

## 2011-03-13 MED ORDER — WITCH HAZEL-GLYCERIN EX PADS: 1.0000 "application " | MEDICATED_PAD | CUTANEOUS | Status: DC | PRN

## 2011-03-13 MED ORDER — PRENATAL PLUS 27-1 MG PO TABS
1.0000 | ORAL_TABLET | Freq: Every day | ORAL | Status: DC
  Administered 2011-03-13 – 2011-03-14 (×2): 1 via ORAL
  Filled 2011-03-13 (×2): qty 1

## 2011-03-13 MED ORDER — SIMETHICONE 80 MG PO CHEW: 80.0000 mg | CHEWABLE_TABLET | ORAL | Status: DC | PRN

## 2011-03-13 MED ORDER — TETANUS-DIPHTH-ACELL PERTUSSIS 5-2.5-18.5 LF-MCG/0.5 IM SUSP
0.5000 mL | Freq: Once | INTRAMUSCULAR | Status: AC
  Administered 2011-03-14: 0.5 mL via INTRAMUSCULAR
  Filled 2011-03-13: qty 0.5

## 2011-03-13 MED ORDER — SODIUM CHLORIDE 0.9 % IV SOLN: 250.0000 mL | INTRAVENOUS | Status: DC

## 2011-03-13 MED ORDER — DIBUCAINE 1 % RE OINT: 1.0000 "application " | TOPICAL_OINTMENT | RECTAL | Status: DC | PRN

## 2011-03-13 MED ORDER — OXYCODONE-ACETAMINOPHEN 5-325 MG PO TABS: 1.0000 | ORAL_TABLET | ORAL | Status: DC | PRN

## 2011-03-13 MED ORDER — SODIUM CHLORIDE 0.9 % IJ SOLN: 3.0000 mL | INTRAMUSCULAR | Status: DC | PRN

## 2011-03-13 MED ORDER — ZOLPIDEM TARTRATE 5 MG PO TABS: 5.0000 mg | ORAL_TABLET | Freq: Every evening | ORAL | Status: DC | PRN

## 2011-03-13 MED ORDER — ONDANSETRON HCL 4 MG PO TABS: 4.0000 mg | ORAL_TABLET | ORAL | Status: DC | PRN

## 2011-03-13 NOTE — Anesthesia Postprocedure Evaluation (Signed)
  Anesthesia Post-op Note  Patient: Cheyenne Hahn  Procedure(s) Performed: * No procedures listed *  Patient Location: PACU and Women's Unit  Anesthesia Type: Epidural  Level of Consciousness: awake, alert  and oriented  Airway and Oxygen Therapy: Patient Spontanous Breathing  Post-op Pain: none Post-op Assessment: Post-op Vital signs reviewed  Post-op Vital Signs: Reviewed and stable  Complications: No apparent anesthesia complications

## 2011-03-14 MED ORDER — OXYCODONE-ACETAMINOPHEN 5-325 MG PO TABS: 1.0000 | ORAL_TABLET | ORAL | Status: DC | PRN

## 2011-03-14 NOTE — Progress Notes (Signed)
Post Partum Day 2 Subjective: no complaints, up ad lib, voiding, tolerating PO and + flatus  Objective: Blood pressure 106/55, pulse 83, temperature 98.4 F (36.9 C), temperature source Oral, resp. rate 18, height 5\' 3"  (1.6 m), weight 67.132 kg (148 lb), SpO2 100.00%, unknown if currently breastfeeding.  Physical Exam:  General: alert and no distress Lochia: appropriate Uterine Fundus: firm Incision: n/a DVT Evaluation: No evidence of DVT seen on physical exam.   Basename 03/13/11 0810 03/11/11 1340  HGB 9.1* 10.7*  HCT 28.1* 32.3*    Assessment/Plan: Discharge home   LOS: 3 days   Cheyenne Hahn A 03/14/2011, 9:23 AM

## 2011-03-14 NOTE — Discharge Summary (Signed)
Obstetric Discharge Summary Reason for Admission: induction of labor Prenatal Procedures: ultrasound Intrapartum Procedures: spontaneous vaginal delivery Postpartum Procedures: none Complications-Operative and Postpartum: none Hemoglobin  Date Value Range Status  03/13/2011 9.1* 12.0-15.0 (g/dL) Final     HCT  Date Value Range Status  03/13/2011 28.1* 36.0-46.0 (%) Final    Discharge Diagnoses: Term Pregnancy-delivered  Discharge Information: Date: 03/14/2011 Activity: pelvic rest Diet: routine Medications: PNV and Percocet Condition: stable Instructions: refer to practice specific booklet Discharge to: home Follow-up Information    Follow up with HARPER,CHARLES A, MD. Make an appointment in 6 weeks.   Contact information:   [address] [place] [address] [phone]          Newborn Data: Live born female  Birth Weight: 6 lb 13.5 oz (3104 g) APGAR: 8, 9  Home with mother.  HARPER,CHARLES A 03/14/2011, 9:24 AM

## 2011-03-15 NOTE — Consult Note (Signed)
NAMEDEVAN, BABINO NO.:  [id number]  MEDICAL RECORD NO.:  [id number]  LOCATION:  [facility]                          FACILITY:  [facility]  PHYSICIAN:  Charles A. Clearance Coots, M.D.DATE OF BIRTH:  03/13/1991  DATE OF CONSULTATION:  02/26/2011 DATE OF DISCHARGE:  02/26/2011                                CONSULTATION   HISTORY:  A 38-week gestation, presents to Triage with uterine contractions.  The patient was afebrile and vital signs were stable. Evaluation revealed uterine contractions, but the patient on cervical exam was found not to be in labor.  She was discharged undelivered on February 26, 2011 in good condition.     Charles A. Clearance Coots, M.D.     CAH/MEDQ  D:  03/15/2011  T:  03/15/2011  Job:  [id number]

## 2011-04-29 LAB — DIFFERENTIAL
Basophils Relative: 0
Eosinophils Absolute: 0
Eosinophils Relative: 1
Monocytes Relative: 5
Neutrophils Relative %: 71

## 2011-04-29 LAB — COMPREHENSIVE METABOLIC PANEL
AST: 18
Albumin: 4.2
Alkaline Phosphatase: 44
Chloride: 108
Creatinine, Ser: 0.85
GFR calc Af Amer: >60
Potassium: 3.7
Total Bilirubin: 1.3 — ABNORMAL HIGH

## 2011-04-29 LAB — CBC
MCHC: 34.2
MCV: 89.5
RBC: 4.57

## 2011-04-29 LAB — URINALYSIS, ROUTINE W REFLEX MICROSCOPIC
Nitrite: NEGATIVE
Specific Gravity, Urine: 1.027
Urobilinogen, UA: 0.2

## 2011-04-29 LAB — URINE MICROSCOPIC-ADD ON

## 2011-10-29 ENCOUNTER — Inpatient Hospital Stay (HOSPITAL_COMMUNITY)
Admission: AD | Admit: 2011-10-29 | Discharge: 2011-10-29 | Disposition: A | Discharge status: Home / Self Care | Payer: Self-pay | Source: Ambulatory Visit | Attending: Obstetrics | Admitting: Obstetrics

## 2011-10-29 ENCOUNTER — Encounter (HOSPITAL_COMMUNITY): Payer: Self-pay | Admitting: *Deleted

## 2011-10-29 DIAGNOSIS — R42 Dizziness and giddiness: Secondary | ICD-10-CM | POA: Insufficient documentation

## 2011-10-29 DIAGNOSIS — K2990 Gastroduodenitis, unspecified, without bleeding: Secondary | ICD-10-CM

## 2011-10-29 DIAGNOSIS — K2970 Gastritis, unspecified, without bleeding: Secondary | ICD-10-CM | POA: Insufficient documentation

## 2011-10-29 LAB — POCT PREGNANCY, URINE: Preg Test, Ur: NEGATIVE

## 2011-10-29 LAB — CBC
HCT: 38.7 % (ref 36.0–46.0)
MCH: 29.4 pg (ref 26.0–34.0)
MCHC: 32.6 g/dL (ref 30.0–36.0)
MCV: 90.4 fL (ref 78.0–100.0)
Platelets: 229 10*3/uL (ref 150–400)
RDW: 13.3 % (ref 11.5–15.5)

## 2011-10-29 LAB — URINALYSIS, ROUTINE W REFLEX MICROSCOPIC
Bilirubin Urine: NEGATIVE
Hgb urine dipstick: NEGATIVE
Ketones, ur: NEGATIVE mg/dL
Protein, ur: NEGATIVE mg/dL
Urobilinogen, UA: 1 mg/dL (ref 0.0–1.0)

## 2011-10-29 LAB — BASIC METABOLIC PANEL
BUN: 8 mg/dL (ref 6–23)
CO2: 28 mEq/L (ref 19–32)
Calcium: 9 mg/dL (ref 8.4–10.5)
Creatinine, Ser: 0.68 mg/dL (ref 0.50–1.10)
Glucose, Bld: 79 mg/dL (ref 70–99)

## 2011-10-29 MED ORDER — ONDANSETRON HCL 4 MG/2ML IJ SOLN
4.0000 mg | Freq: Once | INTRAMUSCULAR | Status: AC
  Administered 2011-10-29: 4 mg via INTRAVENOUS
  Filled 2011-10-29: qty 2

## 2011-10-29 MED ORDER — RANITIDINE HCL 150 MG PO CAPS: 150.0000 mg | ORAL_CAPSULE | Freq: Two times a day (BID) | ORAL | Status: DC

## 2011-10-29 MED ORDER — MEDROXYPROGESTERONE ACETATE 150 MG/ML IM SUSP
150.0000 mg | Freq: Once | INTRAMUSCULAR | Status: AC
  Administered 2011-10-29: 150 mg via INTRAMUSCULAR
  Filled 2011-10-29: qty 1

## 2011-10-29 MED ORDER — ONDANSETRON 8 MG PO TBDP: 8.0000 mg | ORAL_TABLET | Freq: Three times a day (TID) | ORAL | Status: AC | PRN

## 2011-10-29 MED ORDER — LACTATED RINGERS IV BOLUS (SEPSIS)
1000.0000 mL | Freq: Once | INTRAVENOUS | Status: AC
  Administered 2011-10-29: 1000 mL via INTRAVENOUS

## 2011-10-29 NOTE — MAU Note (Signed)
Last wk had to go to the ER- kept getting light headed and having chest pain.  Still feeling light headed and yesterday started vomiting, noted blood in emesis this morning.

## 2011-10-29 NOTE — MAU Provider Note (Signed)
History     CSN: [id number]  Arrival date and time: 10/29/11 0825   None     Chief Complaint  Patient presents with  . Hematemesis  . Dizziness   HPI  Cheyenne Hahn is 21 y.o. presenting to the MAU for evaluation of 3 day history of nausea, vomiting and dizziness.  States intermittent vomiting began Monday and became so severe today that she "just can't keep anything down".  Also reports one episode of vomiting this morning producing 1 bright red blood clot. Dizziness is described as intermittent and increasing when standing up quickly.  States last menses in January but she did have mild spotting Mon and Tues which she states could possible be her period because they are sometimes irregular.  Patient is sexually active and not currently uses contraception. Denies fever, SOB, CP, constipation, bloody or dark stools, dysuria, vaginal discharge or itching.       Past Medical History  Diagnosis Date  . No pertinent past medical history     Past Surgical History  Procedure Date  . Wisdom tooth extraction   . No past surgeries     Family History  Problem Relation Age of Onset  . Hypertension Mother   . Hypertension Maternal Aunt   . Hypertension Maternal Uncle   . Hypertension Maternal Grandmother   . Diabetes Maternal Grandmother     History  Substance Use Topics  . Smoking status: Never Smoker   . Smokeless tobacco: Never Used  . Alcohol Use: No    Allergies: No Known Allergies  Prescriptions prior to admission  Medication Sig Dispense Refill  . Ferrous Sulfate (IRON) 90 (18 FE) MG TABS Take 1 tablet by mouth daily.          Review of Systems  Constitutional: Negative for fever, chills, weight loss and malaise/fatigue.  HENT: Negative for congestion and sore throat.   Eyes: Negative for blurred vision and double vision.  Respiratory: Negative for cough, hemoptysis, shortness of breath, wheezing and stridor.   Cardiovascular: Negative for chest pain,  palpitations and orthopnea.  Gastrointestinal: Positive for nausea, vomiting and abdominal pain (reports mild lower abdominal cramping). Negative for diarrhea, constipation, blood in stool and melena.  Genitourinary: Negative for dysuria, urgency, frequency, hematuria and flank pain.  Musculoskeletal: Negative for myalgias, joint pain and falls.  Skin: Negative for itching and rash.  Neurological: Positive for dizziness. Negative for tingling, tremors, sensory change, speech change, focal weakness, seizures, loss of consciousness and headaches.  Psychiatric/Behavioral: Negative for depression and suicidal ideas. The patient is not nervous/anxious.    Physical Exam   Blood pressure 111/78, pulse 87, temperature 97.5 F (36.4 C), temperature source Oral, resp. rate 16, height 5\' 3"  (1.6 m), weight 111 lb (50.349 kg), last menstrual period 08/23/2011, SpO2 100.00%, not currently breastfeeding.  Physical Exam  Constitutional: She is oriented to person, place, and time. She appears well-developed and well-nourished.  HENT:  Head: Normocephalic and atraumatic.  Eyes: Pupils are equal, round, and reactive to light.  Neck: Normal range of motion. Neck supple.  Cardiovascular: Normal rate, regular rhythm, normal heart sounds and intact distal pulses.  Exam reveals no gallop and no friction rub.   No murmur heard. Respiratory: Effort normal and breath sounds normal. No respiratory distress. She has no wheezes. She has no rales. She exhibits no tenderness.  GI: Soft. Bowel sounds are normal. She exhibits no mass. There is tenderness (very mild lower abdominal tenderness ). There is no rebound and  no guarding.  Musculoskeletal: Normal range of motion.  Neurological: She is alert and oriented to person, place, and time. Coordination normal.  Skin: Skin is warm and dry.  Psychiatric: She has a normal mood and affect. Her behavior is normal. Judgment and thought content normal.   CBC Normal and improved  from previous visit  MAU Course  Procedures Will check UA, urine HCG and BMP Give 1L IV LR and 4mg  IV Zofran PO challenge  Assessment and Plan   Patient tolerating PO challenge well.  Reports improvement in nausea. Urine HCG negative  UA and BMP unremarkable.  Will give Depo per patient request.   Results for orders placed during the hospital encounter of 10/29/11 (from the past 24 hour(s))  URINALYSIS, ROUTINE W REFLEX MICROSCOPIC     Status: Abnormal   Collection Time   10/29/11  8:42 AM      Component Value Range   Color, Urine YELLOW  YELLOW    APPearance CLEAR  CLEAR    Specific Gravity, Urine >1.030 (*) 1.005 - 1.030    pH 6.0  5.0 - 8.0    Glucose, UA NEGATIVE  NEGATIVE (mg/dL)   Hgb urine dipstick NEGATIVE  NEGATIVE    Bilirubin Urine NEGATIVE  NEGATIVE    Ketones, ur NEGATIVE  NEGATIVE (mg/dL)   Protein, ur NEGATIVE  NEGATIVE (mg/dL)   Urobilinogen, UA 1.0  0.0 - 1.0 (mg/dL)   Nitrite NEGATIVE  NEGATIVE    Leukocytes, UA NEGATIVE  NEGATIVE   POCT PREGNANCY, URINE     Status: Normal   Collection Time   10/29/11  8:51 AM      Component Value Range   Preg Test, Ur NEGATIVE  NEGATIVE   BASIC METABOLIC PANEL     Status: Normal   Collection Time   10/29/11 10:27 AM      Component Value Range   Sodium 137  135 - 145 (mEq/L)   Potassium 3.8  3.5 - 5.1 (mEq/L)   Chloride 104  96 - 112 (mEq/L)   CO2 28  19 - 32 (mEq/L)   Glucose, Bld 79  70 - 99 (mg/dL)   BUN 8  6 - 23 (mg/dL)   Creatinine, Ser 8.11  0.50 - 1.10 (mg/dL)   Calcium 9.0  8.4 - 91.4 (mg/dL)   GFR calc non Af Amer >90  >90 (mL/min)   GFR calc Af Amer >90  >90 (mL/min)  CBC     Status: Normal   Collection Time   10/29/11 10:27 AM      Component Value Range   WBC 6.5  4.0 - 10.5 (K/uL)   RBC 4.28  3.87 - 5.11 (MIL/uL)   Hemoglobin 12.6  12.0 - 15.0 (g/dL)   HCT 78.2  95.6 - 21.3 (%)   MCV 90.4  78.0 - 100.0 (fL)   MCH 29.4  26.0 - 34.0 (pg)   MCHC 32.6  30.0 - 36.0 (g/dL)   RDW 08.6  57.8 - 46.9 (%)    Platelets 229  150 - 400 (K/uL)     Sanjith Siwek FNP Student 10/29/2011, 11:48 AM   I have participated in the evaluation and treatment of this patient. CBC drawn and orthostatic VS, since patient has complained of some dizziness when she gets up to quickly. Patient states dizziness has resolved after IV hydration.  Patient taking po fluids and eating without nausea or vomiting.  Assessment: Gastritis   Plan:   Rx Zofran   Zantac Rx  Follow up in the office with Dr. Clearance Coots   Return here as needed

## 2011-10-29 NOTE — Discharge Instructions (Signed)
Gastritis Gastritis is an irritation of the stomach. This is often caused by medications, but can be from anything that bothers the stomach. Other stomach irritants are:  Alcohol.   Caffeine.   Nicotine.   Spicy or acid foods.   Medications for pain and arthritis. Aspirin and other anti-inflammatory medicines such as ibuprofen (Advil), naproxen (Aleve), and ketoprofen (Orudis) can be highly irritating.   Emotional distress.  Symptoms of gastritis may include:  Abdominal pain.   Indigestion.   Nausea and or vomiting.   Bleeding.  Some patients with chronic gastritis and ulcers have been infected by a germ. They may need special testing. Medications which kill germs can be used to cure this condition. Treatment includes avoiding the substances mentioned above that are known to cause stomach trouble. Medications used to treat gastritis can include:  Antacids.   Medicines to control vomiting.   Acid blocking medicines.  Symptoms of gastritis usually improve within 2-3 days of starting treatment. Call your caregiver if you are not better in a few days. SEEK MEDICAL CARE IF:   You have increased stomach or chest pain.   You vomit blood.   You faint or feel lightheaded.   You cannot keep fluids down.   You pass bloody or black stools.   You develop severe back pain.  MAKE SURE YOU:   Understand these instructions.   Will watch your condition.   Will get help right away if you are not doing well or get worse.  Document Released: 07/20/2005 Document Revised: 07/09/2011 Document Reviewed: 01/05/2007 [facility] Patient Information 2012 [place], [place].Gastritis Gastritis is an irritation of the stomach. This is often caused by medications, but can be from anything that bothers the stomach. Other stomach irritants are:  Alcohol.   Caffeine.   Nicotine.   Spicy or acid foods.   Medications for pain and arthritis. Aspirin and other anti-inflammatory medicines such as  ibuprofen (Advil), naproxen (Aleve), and ketoprofen (Orudis) can be highly irritating.   Emotional distress.  Symptoms of gastritis may include:  Abdominal pain.   Indigestion.   Nausea and or vomiting.   Bleeding.  Some patients with chronic gastritis and ulcers have been infected by a germ. They may need special testing. Medications which kill germs can be used to cure this condition. Treatment includes avoiding the substances mentioned above that are known to cause stomach trouble. Medications used to treat gastritis can include:  Antacids.   Medicines to control vomiting.   Acid blocking medicines.  Symptoms of gastritis usually improve within 2-3 days of starting treatment. Call your caregiver if you are not better in a few days. SEEK MEDICAL CARE IF:   You have increased stomach or chest pain.   You vomit blood.   You faint or feel lightheaded.   You cannot keep fluids down.   You pass bloody or black stools.   You develop severe back pain.  MAKE SURE YOU:   Understand these instructions.   Will watch your condition.   Will get help right away if you are not doing well or get worse.  Document Released: 07/20/2005 Document Revised: 07/09/2011 Document Reviewed: 01/05/2007 [facility] Patient Information 2012 [place], [place].

## 2011-10-29 NOTE — MAU Note (Signed)
Has a 70 month old; G1P1; is not on any BC;

## 2012-11-30 IMAGING — CR DG CHEST 2V
2 series · 2 of 2 positions shown · non-contrast
Comparison: None.

CLINICAL DATA: Left breast pain and shortness of breath.

CHEST - 2 VIEW

[w chest pa]
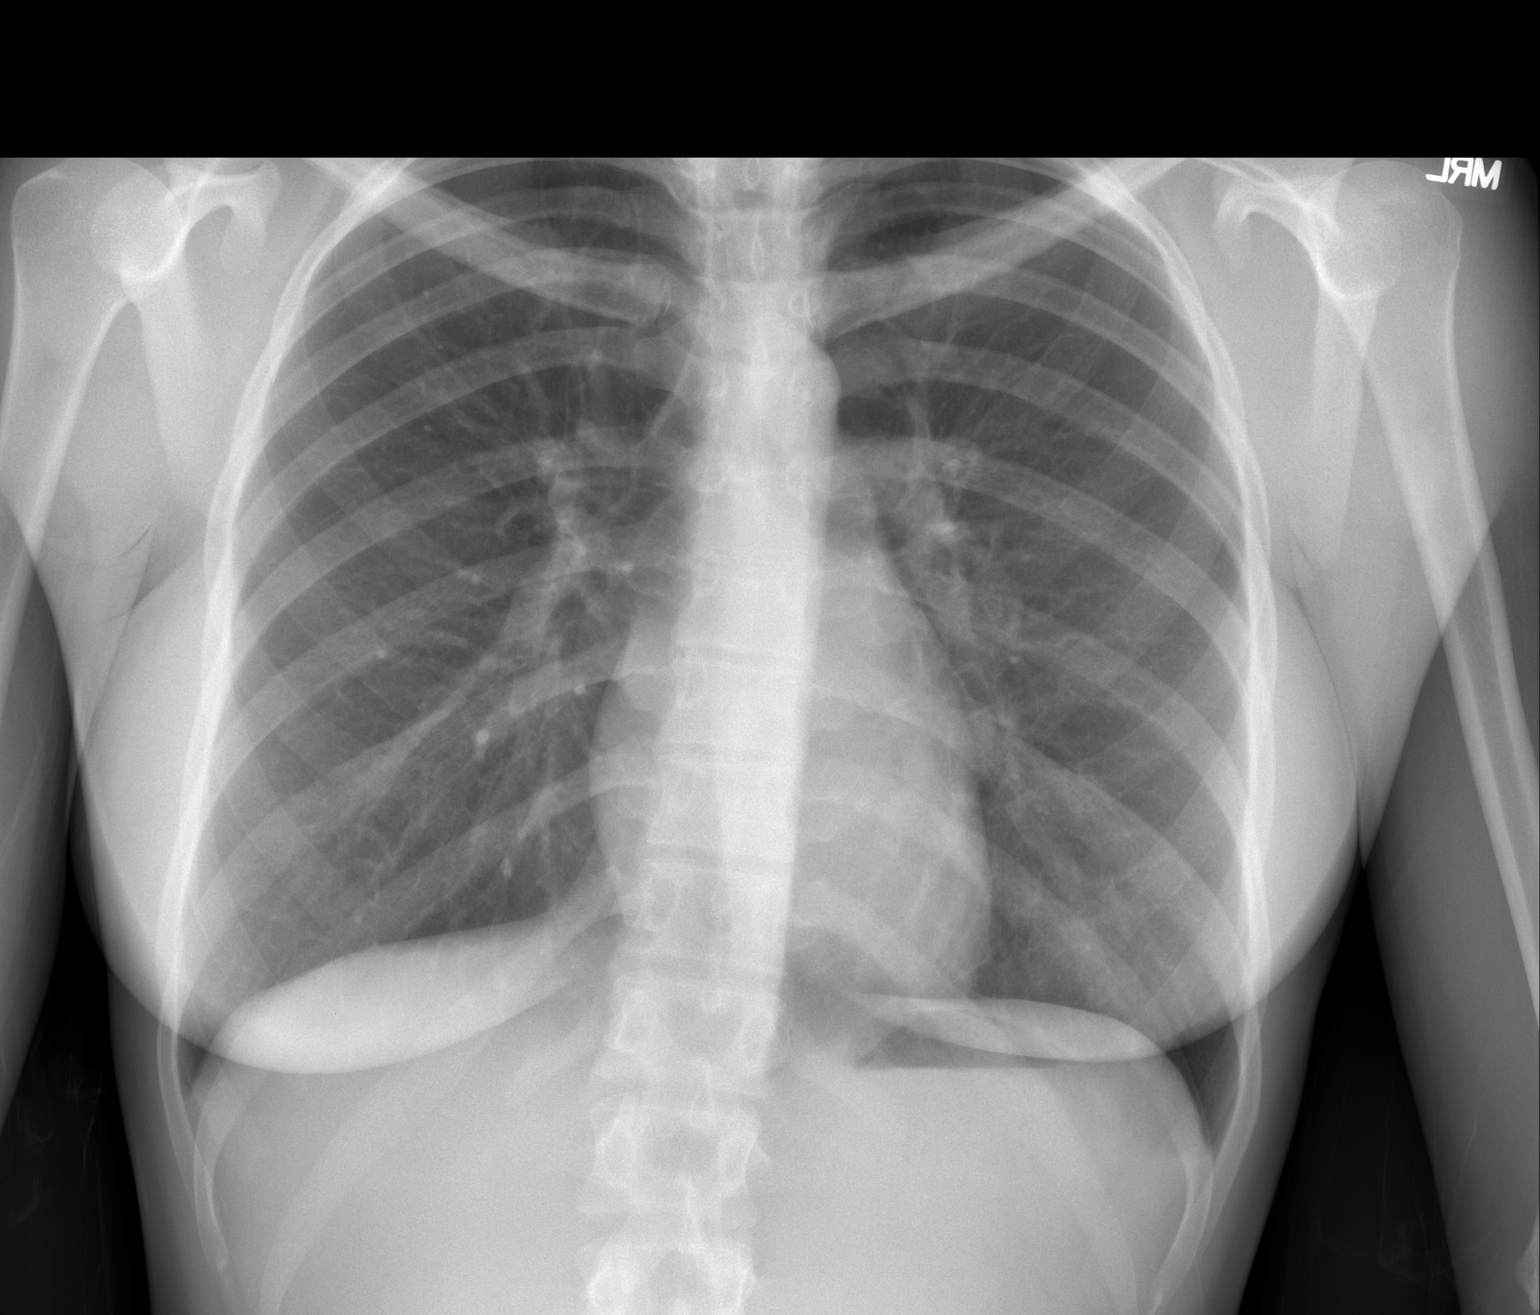

[w chest lat]
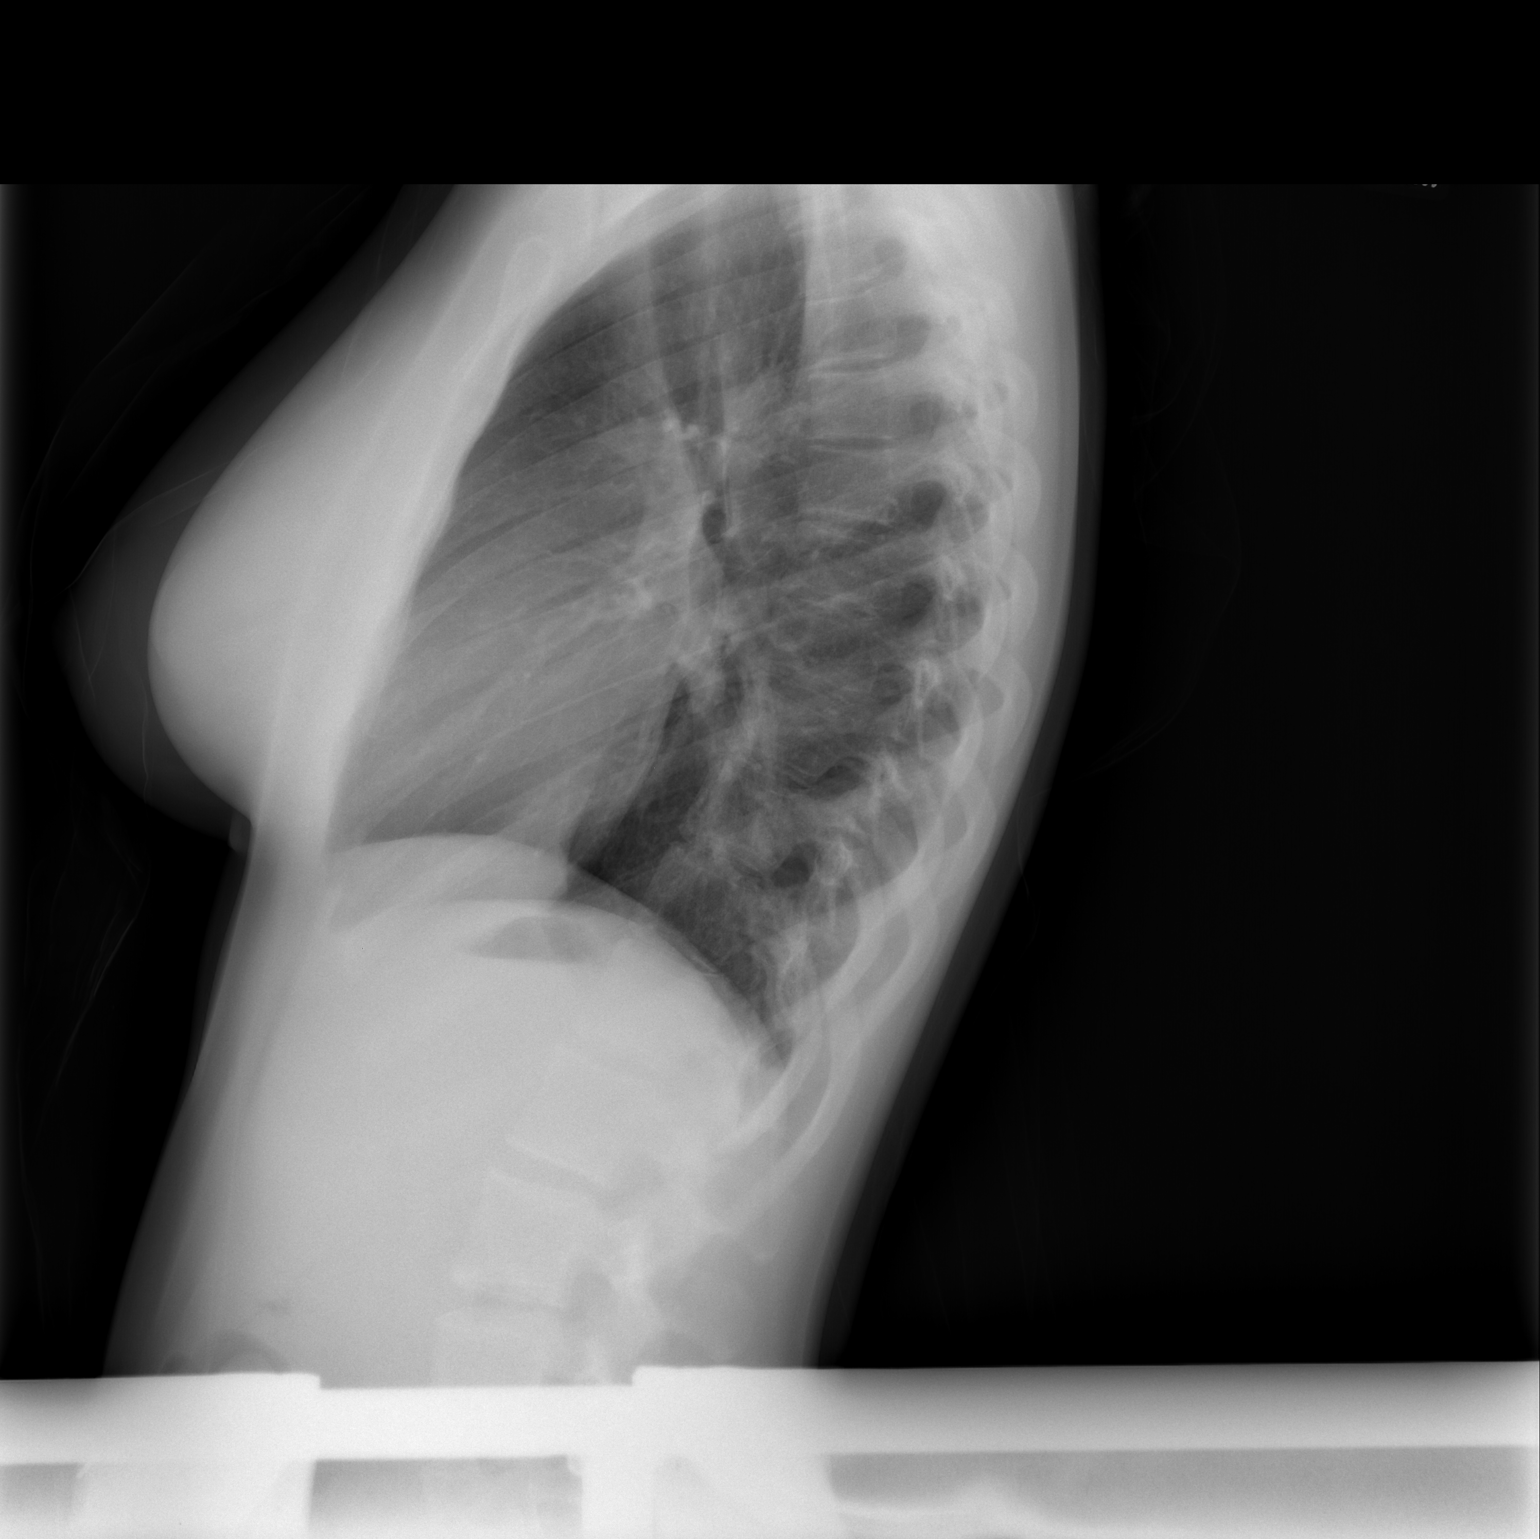

[2 of 2 positions shown; findings below may reference images not displayed]

FINDINGS: The heart size and mediastinal contours are normal. The
lungs are clear. There is no pleural effusion or pneumothorax. No
acute osseous findings are identified.
IMPRESSION: No active cardiopulmonary process.

## 2013-04-11 ENCOUNTER — Emergency Department (HOSPITAL_COMMUNITY)
Admission: EM | Admit: 2013-04-11 | Discharge: 2013-04-11 | Discharge status: Against Medical Advice | Payer: Medicaid Other | Attending: Emergency Medicine | Admitting: Emergency Medicine

## 2013-04-11 ENCOUNTER — Emergency Department (HOSPITAL_COMMUNITY): Payer: Medicaid Other

## 2013-04-11 ENCOUNTER — Encounter (HOSPITAL_COMMUNITY): Payer: Self-pay | Admitting: Emergency Medicine

## 2013-04-11 DIAGNOSIS — R05 Cough: Secondary | ICD-10-CM | POA: Insufficient documentation

## 2013-04-11 DIAGNOSIS — J069 Acute upper respiratory infection, unspecified: Secondary | ICD-10-CM | POA: Insufficient documentation

## 2013-04-11 DIAGNOSIS — R059 Cough, unspecified: Secondary | ICD-10-CM | POA: Insufficient documentation

## 2013-04-11 NOTE — ED Notes (Signed)
Called pt, no answer.

## 2013-04-11 NOTE — ED Notes (Signed)
Pt c/o cough and URI with pain with cough; pt sts possible some blood when coughing very hard; pt denies fever

## 2013-06-19 ENCOUNTER — Encounter (HOSPITAL_COMMUNITY): Payer: Self-pay | Admitting: Emergency Medicine

## 2013-06-19 ENCOUNTER — Emergency Department (INDEPENDENT_AMBULATORY_CARE_PROVIDER_SITE_OTHER)
Admission: EM | Admit: 2013-06-19 | Discharge: 2013-06-19 | Disposition: A | Discharge status: Home / Self Care | Payer: Medicaid Other | Source: Home / Self Care | Attending: Emergency Medicine | Admitting: Emergency Medicine

## 2013-06-19 ENCOUNTER — Emergency Department (INDEPENDENT_AMBULATORY_CARE_PROVIDER_SITE_OTHER): Payer: Medicaid Other

## 2013-06-19 DIAGNOSIS — M4120 Other idiopathic scoliosis, site unspecified: Secondary | ICD-10-CM

## 2013-06-19 DIAGNOSIS — M419 Scoliosis, unspecified: Secondary | ICD-10-CM

## 2013-06-19 DIAGNOSIS — R079 Chest pain, unspecified: Secondary | ICD-10-CM

## 2013-06-19 MED ORDER — GI COCKTAIL ~~LOC~~
30.0000 mL | Freq: Once | ORAL | Status: AC
  Administered 2013-06-19: 30 mL via ORAL

## 2013-06-19 MED ORDER — GI COCKTAIL ~~LOC~~
ORAL | Status: AC
  Filled 2013-06-19: qty 30

## 2013-06-19 MED ORDER — IBUPROFEN 600 MG PO TABS: 600.0000 mg | ORAL_TABLET | Freq: Three times a day (TID) | ORAL | Status: DC

## 2013-06-19 NOTE — ED Provider Notes (Signed)
CSN: [id number]     Arrival date & time 06/19/13  1545 History   First MD Initiated Contact with Patient 06/19/13 1738     Chief Complaint  Patient presents with  . Chest Pain   (Consider location/radiation/quality/duration/timing/severity/associated sxs/prior Treatment) Patient is a 22 y.o. female presenting with chest pain. The history is provided by the patient.  Chest Pain Pain location:  Substernal area and R chest Pain quality: aching   Pain radiates to:  Does not radiate Pain radiates to the back: no   Pain severity:  Moderate Onset quality:  Gradual Duration: since yesterday. Timing:  Constant Progression:  Unchanged Chronicity:  New Context: at rest   Relieved by:  None tried Worsened by:  Deep breathing Ineffective treatments:  Antacids Associated symptoms: no abdominal pain, no anxiety, no cough, no diaphoresis, no fever, no heartburn, no nausea, no palpitations, no shortness of breath, no syncope and not vomiting   Associated symptoms comment:  Dizzy briefly this afternoon, none at this time   Past Medical History  Diagnosis Date  . No pertinent past medical history    Past Surgical History  Procedure Laterality Date  . Wisdom tooth extraction    . No past surgeries     Family History  Problem Relation Age of Onset  . Hypertension Mother   . Hypertension Maternal Aunt   . Hypertension Maternal Uncle   . Hypertension Maternal Grandmother   . Diabetes Maternal Grandmother    History  Substance Use Topics  . Smoking status: Never Smoker   . Smokeless tobacco: Never Used  . Alcohol Use: No   OB History   Grav Para Term Preterm Abortions TAB SAB Ect Mult Living   1 1 1  0 0 0 0 0 0 1     Review of Systems  Constitutional: Negative for fever and diaphoresis.  Respiratory: Negative for cough and shortness of breath.   Cardiovascular: Positive for chest pain. Negative for palpitations and syncope.  Gastrointestinal: Negative for heartburn, nausea,  vomiting and abdominal pain.    Allergies  Review of patient's allergies indicates no known allergies.  Home Medications   Current Outpatient Rx  Name  Route  Sig  Dispense  Refill  . acetaminophen (TYLENOL) 500 MG tablet   Oral   Take 1,000 mg by mouth 3 (three) times daily as needed for pain.         . Acetaminophen-Aspirin Buffered (EXCEDRIN BACK & BODY) 250-250 MG tablet   Oral   Take 1 tablet by mouth daily as needed for pain.         Marland Kitchen ibuprofen (ADVIL,MOTRIN) 600 MG tablet   Oral   Take 1 tablet (600 mg total) by mouth 3 (three) times daily.   21 tablet   0    BP 111/75  Pulse 90  Temp(Src) 98.1 F (36.7 C) (Oral)  Resp 16  SpO2 100%  LMP 05/02/2013 Physical Exam  Constitutional: She appears well-developed and well-nourished. No distress.  Cardiovascular: Normal rate and regular rhythm.   Pulmonary/Chest: Effort normal and breath sounds normal. She exhibits no tenderness.  Skin: Skin is warm and dry. No rash noted.    ED Course  Procedures (including critical care time) Labs Review Labs Reviewed - No data to display Imaging Review Dg Chest 2 View  06/19/2013   CLINICAL DATA:  Chest pain  EXAM: CHEST  2 VIEW  COMPARISON:  04/11/2013  FINDINGS: There is 13 degrees of levoconvex thoracic scoliosis as measured between  T4 and T12.  The lungs appear clear. The cardiac and mediastinal contours normal. No pleural effusion identified.  IMPRESSION: 1. No acute findings.  Mild levoconvex thoracic scoliosis.   Electronically Signed   By: Herbie Baltimore M.D.   On: 06/19/2013 17:22    EKG Interpretation    Date/Time:    Ventricular Rate:    PR Interval:    QRS Duration:   QT Interval:    QTC Calculation:   R Axis:     Text Interpretation:              MDM   1. Chest pain   2. Scoliosis   EKg shows nsr, 83bpm.  Chest x-ray negative except shows scoliosis.  GI cocktail did not alleviate pt sx.  Rx ibuprofen 600mg  TID #21. Pt to go to ER if pain  persists or worsens.      Cathlyn Parsons, NP 06/19/13 [phone]

## 2013-06-19 NOTE — Discharge Instructions (Signed)
If you develop worsening chest pain, getting short of breath, have palpitations, go to the ER for more help.    Chest Pain (Nonspecific) It is often hard to give a specific diagnosis for the cause of chest pain. There is always a chance that your pain could be related to something serious, such as a heart attack or a blood clot in the lungs. You need to follow up with your caregiver for further evaluation. CAUSES   Heartburn.  Pneumonia or bronchitis.  Anxiety or stress.  Inflammation around your heart (pericarditis) or lung (pleuritis or pleurisy).  A blood clot in the lung.  A collapsed lung (pneumothorax). It can develop suddenly on its own (spontaneous pneumothorax) or from injury (trauma) to the chest.  Shingles infection (herpes zoster virus). The chest wall is composed of bones, muscles, and cartilage. Any of these can be the source of the pain.  The bones can be bruised by injury.  The muscles or cartilage can be strained by coughing or overwork.  The cartilage can be affected by inflammation and become sore (costochondritis). DIAGNOSIS  Lab tests or other studies, such as X-rays, electrocardiography, stress testing, or cardiac imaging, may be needed to find the cause of your pain.  TREATMENT   Treatment depends on what may be causing your chest pain. Treatment may include:  Acid blockers for heartburn.  Anti-inflammatory medicine.  Pain medicine for inflammatory conditions.  Antibiotics if an infection is present.  You may be advised to change lifestyle habits. This includes stopping smoking and avoiding alcohol, caffeine, and chocolate.  You may be advised to keep your head raised (elevated) when sleeping. This reduces the chance of acid going backward from your stomach into your esophagus.  Most of the time, nonspecific chest pain will improve within 2 to 3 days with rest and mild pain medicine. HOME CARE INSTRUCTIONS   If antibiotics were prescribed, take  your antibiotics as directed. Finish them even if you start to feel better.  For the next few days, avoid physical activities that bring on chest pain. Continue physical activities as directed.  Do not smoke.  Avoid drinking alcohol.  Only take over-the-counter or prescription medicine for pain, discomfort, or fever as directed by your caregiver.  Follow your caregiver's suggestions for further testing if your chest pain does not go away.  Keep any follow-up appointments you made. If you do not go to an appointment, you could develop lasting (chronic) problems with pain. If there is any problem keeping an appointment, you must call to reschedule. SEEK MEDICAL CARE IF:   You think you are having problems from the medicine you are taking. Read your medicine instructions carefully.  Your chest pain does not go away, even after treatment.  You develop a rash with blisters on your chest. SEEK IMMEDIATE MEDICAL CARE IF:   You have increased chest pain or pain that spreads to your arm, neck, jaw, back, or abdomen.  You develop shortness of breath, an increasing cough, or you are coughing up blood.  You have severe back or abdominal pain, feel nauseous, or vomit.  You develop severe weakness, fainting, or chills.  You have a fever. THIS IS AN EMERGENCY. Do not wait to see if the pain will go away. Get medical help at once. Call your local emergency services (911 in [place] Do not drive yourself to the hospital. MAKE SURE YOU:   Understand these instructions.  Will watch your condition.  Will get help right away if  you are not doing well or get worse. Document Released: 04/29/2005 Document Revised: 10/12/2011 Document Reviewed: 02/23/2008 [facility] Patient Information 2014 [place].

## 2013-06-19 NOTE — ED Notes (Signed)
Pt states that the pain is felt under her breast. Mw,cma

## 2013-06-19 NOTE — ED Provider Notes (Signed)
Medical screening examination/treatment/procedure(s) were performed by non-physician practitioner and as supervising physician I was immediately available for consultation/collaboration.  Leslee Home, M.D.  Reuben Likes, MD 06/19/13 [phone]

## 2013-06-19 NOTE — ED Notes (Signed)
C/o chest pain with dizziness.  States worse with deep breathing and lying back.  Onset yesterday.  Denies cardiac hx  And any other symptoms.  Pt is alert and oriented. No signs of distress.

## 2013-08-03 NOTE — L&D Delivery Note (Signed)
Delivery Note At 10:06 AM a viable female was delivered via Vaginal, Spontaneous Delivery (Presentation: Right Occiput Anterior).  APGAR: 9, 9; weight 6 lb 15.6 oz (3165 g).   Placenta status: Intact, Spontaneous.  Cord: 3 vessels with the following complications: None.  Cord pH: none  Anesthesia: Epidural  Episiotomy: None Lacerations: None Suture Repair: none Est. Blood Loss (mL): 350  Mom to postpartum.  Baby to Couplet care / Skin to Skin.  HARPER,CHARLES A 02/08/2014, 1:35 PM

## 2013-08-07 ENCOUNTER — Inpatient Hospital Stay (HOSPITAL_COMMUNITY): Payer: Medicaid Other

## 2013-08-07 ENCOUNTER — Encounter (HOSPITAL_COMMUNITY): Payer: Self-pay

## 2013-08-07 ENCOUNTER — Inpatient Hospital Stay (HOSPITAL_COMMUNITY)
Admission: AD | Admit: 2013-08-07 | Discharge: 2013-08-07 | Disposition: A | Discharge status: Home / Self Care | Payer: Medicaid Other | Source: Ambulatory Visit | Attending: Obstetrics & Gynecology | Admitting: Obstetrics & Gynecology

## 2013-08-07 DIAGNOSIS — O209 Hemorrhage in early pregnancy, unspecified: Secondary | ICD-10-CM | POA: Insufficient documentation

## 2013-08-07 DIAGNOSIS — R109 Unspecified abdominal pain: Secondary | ICD-10-CM | POA: Insufficient documentation

## 2013-08-07 DIAGNOSIS — O4692 Antepartum hemorrhage, unspecified, second trimester: Secondary | ICD-10-CM

## 2013-08-07 DIAGNOSIS — O4690 Antepartum hemorrhage, unspecified, unspecified trimester: Secondary | ICD-10-CM

## 2013-08-07 DIAGNOSIS — Z87891 Personal history of nicotine dependence: Secondary | ICD-10-CM | POA: Insufficient documentation

## 2013-08-07 HISTORY — DX: Gestational (pregnancy-induced) hypertension without significant proteinuria, unspecified trimester: O13.9

## 2013-08-07 HISTORY — DX: Anemia, unspecified: D64.9

## 2013-08-07 LAB — WET PREP, GENITAL
Trich, Wet Prep: NONE SEEN
YEAST WET PREP: NONE SEEN

## 2013-08-07 LAB — CBC
HCT: 39.1 % (ref 36.0–46.0)
Hemoglobin: 13.6 g/dL (ref 12.0–15.0)
MCH: 30.8 pg (ref 26.0–34.0)
MCHC: 34.8 g/dL (ref 30.0–36.0)
MCV: 88.7 fL (ref 78.0–100.0)
Platelets: 238 10*3/uL (ref 150–400)
RBC: 4.41 MIL/uL (ref 3.87–5.11)
RDW: 12.7 % (ref 11.5–15.5)
WBC: 10.5 10*3/uL (ref 4.0–10.5)

## 2013-08-07 LAB — URINALYSIS, ROUTINE W REFLEX MICROSCOPIC
Bilirubin Urine: NEGATIVE
GLUCOSE, UA: NEGATIVE mg/dL
Ketones, ur: NEGATIVE mg/dL
LEUKOCYTES UA: NEGATIVE
Nitrite: NEGATIVE
PH: 5.5 (ref 5.0–8.0)
PROTEIN: NEGATIVE mg/dL
SPECIFIC GRAVITY, URINE: 1.025 (ref 1.005–1.030)
Urobilinogen, UA: 0.2 mg/dL (ref 0.0–1.0)

## 2013-08-07 LAB — URINE MICROSCOPIC-ADD ON

## 2013-08-07 NOTE — MAU Provider Note (Signed)
History     CSN: [id number]  Arrival date and time: 08/07/13 1103   First Provider Initiated Contact with Patient 08/07/13 1158      Chief Complaint  Patient presents with  . Abdominal Pain  . Vaginal Bleeding   HPI  Ms. Cheyenne Hahn is a 23 y.o. female G2P1001 at [redacted]w[redacted]d who presents with abdominal cramping and vaginal bleeding that started Saturday. The cramping started first and then she noticed bright red vaginal bleeding last night. She has not started prenatal care; she plans to go to [employer], [place] pending.  She currently rates her abdominal pain 8/10; denies active bleeding today. No intercourse recently.   OB History   Grav Para Term Preterm Abortions TAB SAB Ect Mult Living   2 1 1  0 0 0 0 0 0 1      Past Medical History  Diagnosis Date  . No pertinent past medical history   . Pregnancy induced hypertension   . Anemia     Past Surgical History  Procedure Laterality Date  . Wisdom tooth extraction    . No past surgeries      Family History  Problem Relation Age of Onset  . Hypertension Mother   . Hypertension Maternal Aunt   . Hypertension Maternal Uncle   . Hypertension Maternal Grandmother   . Diabetes Maternal Grandmother     History  Substance Use Topics  . Smoking status: Former Games developer  . Smokeless tobacco: Never Used     Comment: 2011  . Alcohol Use: No    Allergies: No Known Allergies  Prescriptions prior to admission  Medication Sig Dispense Refill  . acetaminophen (TYLENOL) 500 MG tablet Take 1,000 mg by mouth 3 (three) times daily as needed for pain.      Marland Kitchen DM-Doxylamine-Acetaminophen (NYQUIL COLD & FLU PO) Take 20 mLs by mouth once.      . pseudoephedrine (SUDAFED) 120 MG 12 hr tablet Take 120 mg by mouth once.       Results for orders placed during the hospital encounter of 08/07/13 (from the past 48 hour(s))  URINALYSIS, ROUTINE W REFLEX MICROSCOPIC     Status: Abnormal   Collection Time    08/07/13 11:20 AM      Result  Value Range   Color, Urine YELLOW  YELLOW   APPearance CLEAR  CLEAR   Specific Gravity, Urine 1.025  1.005 - 1.030   pH 5.5  5.0 - 8.0   Glucose, UA NEGATIVE  NEGATIVE mg/dL   Hgb urine dipstick MODERATE (*) NEGATIVE   Bilirubin Urine NEGATIVE  NEGATIVE   Ketones, ur NEGATIVE  NEGATIVE mg/dL   Protein, ur NEGATIVE  NEGATIVE mg/dL   Urobilinogen, UA 0.2  0.0 - 1.0 mg/dL   Nitrite NEGATIVE  NEGATIVE   Leukocytes, UA NEGATIVE  NEGATIVE  URINE MICROSCOPIC-ADD ON     Status: Abnormal   Collection Time    08/07/13 11:20 AM      Result Value Range   Squamous Epithelial / LPF FEW (*) RARE   RBC / HPF 0-2  <3 RBC/hpf  WET PREP, GENITAL     Status: Abnormal   Collection Time    08/07/13 12:12 PM      Result Value Range   Yeast Wet Prep HPF POC NONE SEEN  NONE SEEN   Trich, Wet Prep NONE SEEN  NONE SEEN   Clue Cells Wet Prep HPF POC FEW (*) NONE SEEN   WBC, Wet Prep HPF POC  FEW (*) NONE SEEN   Comment: MODERATE BACTERIA SEEN  CBC     Status: None   Collection Time    08/07/13 12:15 PM      Result Value Range   WBC 10.5  4.0 - 10.5 K/uL   RBC 4.41  3.87 - 5.11 MIL/uL   Hemoglobin 13.6  12.0 - 15.0 g/dL   HCT 09.839.1  11.936.0 - 14.746.0 %   MCV 88.7  78.0 - 100.0 fL   MCH 30.8  26.0 - 34.0 pg   MCHC 34.8  30.0 - 36.0 g/dL   RDW 82.912.7  56.211.5 - 13.015.5 %   Platelets 238  150 - 400 K/uL   [place] Ob Comp Less 14 Wks  08/07/2013   CLINICAL DATA:  Abdominal and pelvic pain. Vaginal bleeding. 14 week 6 day gestational age by LMP.  EXAM: OBSTETRIC <14 WK ULTRASOUND  TECHNIQUE: Transabdominal ultrasound was performed for evaluation of the gestation as well as the maternal uterus and adnexal regions.  COMPARISON:  None.  FINDINGS: Intrauterine gestational sac: Visualized/normal in shape.  Yolk sac:  Not visualized  Embryo:  Visualized  Cardiac Activity: Visualized  Heart Rate: 163 bpm  CRL:   61  mm   12 w 5 d                  US EDC: 02/14/2014  Maternal uterus/adnexae: No mass or free fluid identified. Both  ovaries are normal in appearance.  IMPRESSION: Single living IUP measuring 12 weeks 5 days with US EDC of 02/14/2014.  No significant maternal uterine or adnexal abnormality identified.   Electronically Signed   By: Myles RosenthalJohn  Stahl M.D.   On: 08/07/2013 13:09    Review of Systems  Constitutional: Negative for fever.  Gastrointestinal: Positive for abdominal pain. Negative for nausea, vomiting, diarrhea and constipation.       + lower bilateral abdominal cramping   Genitourinary: Negative for dysuria, urgency, frequency and hematuria.       No vaginal discharge. + vaginal bleeding. No dysuria.   Musculoskeletal: Negative for back pain.   Physical Exam   Blood pressure 133/90, pulse 91, temperature 98.6 F (37 C), temperature source Oral, resp. rate 16, height 5\' 2"  (1.575 m), weight 50.168 kg (110 lb 9.6 oz), last menstrual period 04/25/2013, SpO2 100.00%.  Physical Exam  Constitutional: She is oriented to person, place, and time. She appears well-developed and well-nourished. No distress.  HENT:  Head: Normocephalic.  Eyes: Pupils are equal, round, and reactive to light.  Neck: Normal range of motion.  Respiratory: Effort normal.  GI: Soft. There is tenderness.  + suprapubic tenderness   Genitourinary:  Speculum exam: Vagina - Moderate amount of dark red vaginal blood in canal, no odor Cervix - Small active bleeding, large amount of brown mucus like discharge at cervical os. Bimanual exam: Cervix 1 cm, soft, thick  Uterus non tender, normal size; enlarged  Left adnexal tenderness, + suprapubic tenderness,  no masses bilaterally Chaperone present for exam.   Musculoskeletal: Normal range of motion.  Neurological: She is alert and oriented to person, place, and time.  Skin: Skin is warm. She is not diaphoretic.    MAU Course  Procedures None  MDM +fht A positive blood type  Transvaginal US   Assessment and Plan   A: 1. Vaginal bleeding in pregnancy, second trimester    2.  Single living IUP on US 12 weeks 5 days   P: Discharge home in stable condition  Pelvic rest discussed Bleeding precautions discussed Return to MAU as needed, if symptoms were to worsen Start prenatal care as soon as possible   Iona Hansen Rasch, NP 08/07/2013, 2:00 PM

## 2013-08-07 NOTE — MAU Note (Signed)
Patient states she has not had a period since 9-23 and might be pregnant. Fetal heart tones heard in triage in the 180's. States she started having abdominal pain 2 days ago and spotting this am.

## 2013-08-07 NOTE — Discharge Instructions (Signed)
Vaginal Bleeding During Pregnancy, Second Trimester  A small amount of bleeding (spotting) is relatively common in pregnancy. It usually stops on its own. There are many causes for bleeding or spotting in pregnancy. Some bleeding may be related to the pregnancy and some may not. Cramping with the bleeding is more serious and concerning. Tell your caregiver if you have any vaginal bleeding.   CAUSES    Infection, inflammation or growths on the cervix.   The placenta may partially or completely be covering the opening of the cervix inside the uterus.   The placenta may have separated from the uterus.   You may be having early/preterm labor.   The cervix is not strong enough to keep a baby inside the uterus (cervical insufficiency).   Many tiny cysts in the uterus instead of pregnancy tissue (molar pregnancy)  SYMPTOMS    Vaginal spotting or bleeding with or without cramps.   Uterine contractions.   Abnormal vaginal discharge.   You may have spotting or spotting after having sexual intercourse.  DIAGNOSIS   To evaluate the pregnancy, your caregiver may:   Do a pelvic exam.   Take blood tests.   Do an ultrasound.  It is very important to follow your caregiver's instructions.   TREATMENT    Evaluation of the pregnancy with blood tests and ultrasound.   Bed rest (getting up to use the bathroom only).   Rho-gam immunization if the mother is Rh negative and the father is Rh positive.   If you are having uterine contractions, you may be given medication to stop the contractions.   If you have cervical insufficiency, you may have a suture placed in the cervix to close it.  HOME CARE INSTRUCTIONS    If your caregiver orders bed rest, you may need to make arrangements for the care of other children and for any other responsibilities. However, your caregiver may allow you to continue light activity.   Keep track of the number of pads you use each day and how soaked (saturated) they are. Write this down.   Do  not use tampons. Do not douche.   Do not have sexual intercourse or orgasms until approved by your physician.   Save any tissue that you pass for your caregiver to see.   Take medicine for cramps only with your caregiver's permission.   Do not take aspirin because it can make you bleed.   Do not exercise, do any strenuous activities or heavy lifting without your caregiver's permission.  SEEK IMMEDIATE MEDICAL CARE IF:    You experience severe cramps in your stomach, back or belly (abdomen).   You have uterine contractions.   You have an oral temperature above 102 F (38.9 C), not controlled by medicine.   You develop chills.   You pass large clots or tissue.   Your bleeding increases or you become light-headed, weak or have fainting episodes.   You have leaking or a gush of fluid from your vagina.  Document Released: 04/29/2005 Document Revised: 10/12/2011 Document Reviewed: 11/08/2008  Exit[employer] Patient Information 2014 [employer].

## 2013-08-07 NOTE — MAU Note (Signed)
LMP was Sept, had not done home test.  Today was first episode of bleeding. Cramping started Friday or Saturday.

## 2013-08-08 ENCOUNTER — Telehealth: Payer: Self-pay | Admitting: General Practice

## 2013-08-08 DIAGNOSIS — O98812 Other maternal infectious and parasitic diseases complicating pregnancy, second trimester: Principal | ICD-10-CM

## 2013-08-08 DIAGNOSIS — A749 Chlamydial infection, unspecified: Secondary | ICD-10-CM

## 2013-08-08 LAB — GC/CHLAMYDIA PROBE AMP
CT Probe RNA: POSITIVE — AB
GC Probe RNA: NEGATIVE

## 2013-08-08 MED ORDER — AZITHROMYCIN 250 MG PO TABS: 1000.0000 mg | ORAL_TABLET | Freq: Once | ORAL | Status: DC

## 2013-08-08 NOTE — Telephone Encounter (Signed)
Called patient, no answer- left message that we have sent an antibiotic to [facility] for her to pickup and to call us if she has any questions or concerns.

## 2013-08-08 NOTE — Telephone Encounter (Signed)
Message copied by Kathee DeltonHILLMAN, CARRIE L on Tue Aug 08, 2013  1:07 PM ------      Message from: Pennie BanterSMITH, MARNI W      Created: Tue Aug 08, 2013  8:44 AM       Telephone call to patient regarding positive chlamydia culture, patient notified.  [facility] per [facility] to [facility] [address]  Instructed patient to notify her partner for treatment.  Report faxed to health department. ------

## 2013-08-15 ENCOUNTER — Encounter (HOSPITAL_COMMUNITY): Payer: Self-pay | Admitting: *Deleted

## 2013-08-15 ENCOUNTER — Inpatient Hospital Stay (HOSPITAL_COMMUNITY)
Admission: AD | Admit: 2013-08-15 | Discharge: 2013-08-15 | Disposition: A | Discharge status: Home / Self Care | Payer: Medicaid Other | Source: Ambulatory Visit | Attending: Obstetrics | Admitting: Obstetrics

## 2013-08-15 DIAGNOSIS — O26859 Spotting complicating pregnancy, unspecified trimester: Secondary | ICD-10-CM | POA: Insufficient documentation

## 2013-08-15 DIAGNOSIS — Z87891 Personal history of nicotine dependence: Secondary | ICD-10-CM | POA: Insufficient documentation

## 2013-08-15 DIAGNOSIS — N888 Other specified noninflammatory disorders of cervix uteri: Secondary | ICD-10-CM

## 2013-08-15 DIAGNOSIS — O219 Vomiting of pregnancy, unspecified: Secondary | ICD-10-CM

## 2013-08-15 DIAGNOSIS — O212 Late vomiting of pregnancy: Secondary | ICD-10-CM | POA: Insufficient documentation

## 2013-08-15 DIAGNOSIS — R109 Unspecified abdominal pain: Secondary | ICD-10-CM | POA: Insufficient documentation

## 2013-08-15 LAB — URINALYSIS, ROUTINE W REFLEX MICROSCOPIC
BILIRUBIN URINE: NEGATIVE
Glucose, UA: NEGATIVE mg/dL
Hgb urine dipstick: NEGATIVE
Ketones, ur: NEGATIVE mg/dL
Leukocytes, UA: NEGATIVE
Nitrite: NEGATIVE
PH: 6 (ref 5.0–8.0)
Protein, ur: NEGATIVE mg/dL
Specific Gravity, Urine: 1.02 (ref 1.005–1.030)
Urobilinogen, UA: 0.2 mg/dL (ref 0.0–1.0)

## 2013-08-15 MED ORDER — ONDANSETRON 4 MG PO TBDP: 4.0000 mg | ORAL_TABLET | Freq: Three times a day (TID) | ORAL | Status: DC | PRN

## 2013-08-15 MED ORDER — HYDROCODONE-ACETAMINOPHEN 5-325 MG PO TABS
1.0000 | ORAL_TABLET | Freq: Once | ORAL | Status: AC
  Administered 2013-08-15: 1 via ORAL
  Filled 2013-08-15: qty 1

## 2013-08-15 MED ORDER — PROMETHAZINE HCL 25 MG PO TABS: 25.0000 mg | ORAL_TABLET | Freq: Four times a day (QID) | ORAL | Status: DC | PRN

## 2013-08-15 MED ORDER — ONDANSETRON 4 MG PO TBDP
4.0000 mg | ORAL_TABLET | Freq: Once | ORAL | Status: AC
  Administered 2013-08-15: 4 mg via ORAL
  Filled 2013-08-15: qty 1

## 2013-08-15 NOTE — MAU Note (Addendum)
Was here a wk ago with bleeding, had stopped, now is passing clots.  Can't keep anything down, not taking any meds. Light cramping in lower abd.

## 2013-08-15 NOTE — Discharge Instructions (Signed)
Morning Sickness °Morning sickness is when you feel sick to your stomach (nauseous) during pregnancy. This nauseous feeling may or may not come with vomiting. It often occurs in the morning but can be a problem any time of day. Morning sickness is most common during the first trimester, but it may continue throughout pregnancy. While morning sickness is unpleasant, it is usually harmless unless you develop severe and continual vomiting (hyperemesis gravidarum). This condition requires more intense treatment.  °CAUSES  °The cause of morning sickness is not completely known but seems to be related to normal hormonal changes that occur in pregnancy. °RISK FACTORS °You are at greater risk if you: °· Experienced nausea or vomiting before your pregnancy. °· Had morning sickness during a previous pregnancy. °· Are pregnant with more than one baby, such as twins. °TREATMENT  °Do not use any medicines (prescription, over-the-counter, or herbal) for morning sickness without first talking to your health care provider. Your health care provider may prescribe or recommend: °· Vitamin B6 supplements. °· Anti-nausea medicines. °· The herbal medicine ginger. °HOME CARE INSTRUCTIONS  °· Only take over-the-counter or prescription medicines as directed by your health care provider. °· Taking multivitamins before getting pregnant can prevent or decrease the severity of morning sickness in most women.   °· Eat a piece of dry toast or unsalted crackers before getting out of bed in the morning.   °· Eat five or six small meals a day.   °· Eat dry and bland foods (rice, baked potato ). Foods high in carbohydrates are often helpful.  °· Do not drink liquids with your meals. Drink liquids between meals.   °· Avoid greasy, fatty, and spicy foods.   °· Get someone to cook for you if the smell of any food causes nausea and vomiting.   °· If you feel nauseous after taking prenatal vitamins, take the vitamins at night or with a snack.  °· Snack  on protein foods (nuts, yogurt, cheese) between meals if you are hungry.   °· Eat unsweetened gelatins for desserts.   °· Wearing an acupressure wristband (worn for sea sickness) may be helpful.   °· Acupuncture may be helpful.   °· Do not smoke.   °· Get a humidifier to keep the air in your house free of odors.   °· Get plenty of fresh air. °SEEK MEDICAL CARE IF:  °· Your home remedies are not working, and you need medicine. °· You feel dizzy or lightheaded. °· You are losing weight. °SEEK IMMEDIATE MEDICAL CARE IF:  °· You have persistent and uncontrolled nausea and vomiting. °· You pass out (faint). °Document Released: 09/10/2006 Document Revised: 03/22/2013 Document Reviewed: 01/04/2013 °ExitCare® Patient Information ©2014 [employer]. ° °

## 2013-08-15 NOTE — MAU Provider Note (Signed)
History     CSN: [id number]  Arrival date and time: 08/15/13 1313   None     Chief Complaint  Patient presents with  . Vaginal Bleeding  . Emesis   HPI  Ms. Cheyenne Hahn is a 23 y.o. female G2P1001 at 6643w0d who presents with recurrent vaginal bleeding in pregnancy. She was seen here on 08/07/2013 with vaginal bleeding; US report was normal; no etiology for the bleeding. Pt reports she stopped bleeding shortly after her visit on 1/5 and began bleeding again yesterday; small amount of bleeding with blood clots. Pt currently is experiencing lower abdominal pain; rates her pain 8/10. She has also requested medication refills for N/V.  Last intercourse: none recent.   OB History   Grav Para Term Preterm Abortions TAB SAB Ect Mult Living   2 1 1  0 0 0 0 0 0 1      Past Medical History  Diagnosis Date  . No pertinent past medical history   . Pregnancy induced hypertension   . Anemia     Past Surgical History  Procedure Laterality Date  . Wisdom tooth extraction    . No past surgeries      Family History  Problem Relation Age of Onset  . Hypertension Mother   . Hypertension Maternal Aunt   . Hypertension Maternal Uncle   . Hypertension Maternal Grandmother   . Diabetes Maternal Grandmother     History  Substance Use Topics  . Smoking status: Former Games developermoker  . Smokeless tobacco: Never Used     Comment: 2011  . Alcohol Use: No    Allergies: No Known Allergies  Prescriptions prior to admission  Medication Sig Dispense Refill  . acetaminophen (TYLENOL) 500 MG tablet Take 1,000 mg by mouth 3 (three) times daily as needed for pain.      Marland Kitchen. azithromycin (ZITHROMAX) 250 MG tablet Take 4 tablets (1,000 mg total) by mouth once.  4 tablet  0  . pseudoephedrine (SUDAFED) 120 MG 12 hr tablet Take 120 mg by mouth once.       Results for orders placed during the hospital encounter of 08/15/13 (from the past 48 hour(s))  URINALYSIS, ROUTINE W REFLEX MICROSCOPIC     Status:  None   Collection Time    08/15/13  1:40 PM      Result Value Range   Color, Urine YELLOW  YELLOW   APPearance CLEAR  CLEAR   Specific Gravity, Urine 1.020  1.005 - 1.030   pH 6.0  5.0 - 8.0   Glucose, UA NEGATIVE  NEGATIVE mg/dL   Hgb urine dipstick NEGATIVE  NEGATIVE   Bilirubin Urine NEGATIVE  NEGATIVE   Ketones, ur NEGATIVE  NEGATIVE mg/dL   Protein, ur NEGATIVE  NEGATIVE mg/dL   Urobilinogen, UA 0.2  0.0 - 1.0 mg/dL   Nitrite NEGATIVE  NEGATIVE   Leukocytes, UA NEGATIVE  NEGATIVE   Comment: MICROSCOPIC NOT DONE ON URINES WITH NEGATIVE PROTEIN, BLOOD, LEUKOCYTES, NITRITE, OR GLUCOSE <1000 mg/dL.   Review of Systems  Constitutional: Negative for fever and chills.  Gastrointestinal: Positive for heartburn, nausea, vomiting and abdominal pain. Negative for diarrhea and constipation.  Genitourinary: Negative for dysuria, urgency, frequency, hematuria and flank pain.       No vaginal discharge. + vaginal bleeding: dark red, with many clots No dysuria.    Physical Exam   Blood pressure 117/72, pulse 78, temperature 98.3 F (36.8 C), temperature source Oral, resp. rate 18, height 5' 2.5" (1.588  m), weight 49.442 kg (109 lb), last menstrual period 04/25/2013. Fetal heart tones 156 bpm    Physical Exam  Constitutional: She is oriented to person, place, and time. She appears well-developed and well-nourished. No distress.  HENT:  Head: Normocephalic.  Eyes: Conjunctivae are normal.  Neck: Neck supple.  GI: Soft. She exhibits no distension and no mass. There is tenderness. There is no rebound and no guarding.  +Bilateral lower abdominal tenderness: Mild   Genitourinary: No vaginal discharge found.  Speculum exam: Vagina - Small amount of creamy discharge, no odor. No blood in the vault.  Cervix -Friability of cervix noted, scant amount of blood from cervix after touching it with a fox swab Bimanual exam: Cervix FT, no CMT  Uterus non tender, gravid  Adnexa non tender, no  masses bilaterally Chaperone present for exam.   Neurological: She is alert and oriented to person, place, and time.  Skin: Skin is warm. She is not diaphoretic.  Psychiatric: Her behavior is normal.    MAU Course  Procedures None  MDM A positive blood type. +fht 1 vicodin given in MAU. Pt says her pain is improved following pain medication Zofran 4 mg PO times 1; pt tolerated crackers and oral fluids in MAU.  Assessment and Plan   A:  1. Nausea and vomiting in pregnancy   2. Spotting during pregnancy   3. Friable cervix     P: Discharge home RX: Phenergan        Zofran Ok to take tylenol as needed, as directed on the bottle  Pelvic rest Bleeding precautions discussed Start prenatal care as soon as possible   Iona Hansen Rasch, NP  08/15/2013, 8:08 PM

## 2013-08-20 NOTE — MAU Provider Note (Signed)
Attestation of Attending Supervision of Advanced Practitioner (CNM/NP): Evaluation and management procedures were performed by the Advanced Practitioner under my supervision and collaboration. I have reviewed the Advanced Practitioner's note and chart, and I agree with the management and plan.  Amilliana Hayworth H. 9:10 PM   

## 2013-08-24 ENCOUNTER — Encounter: Payer: Self-pay | Admitting: Obstetrics

## 2013-08-27 ENCOUNTER — Inpatient Hospital Stay (HOSPITAL_COMMUNITY)
Admission: AD | Admit: 2013-08-27 | Discharge: 2013-08-27 | Disposition: A | Discharge status: Home / Self Care | Payer: Medicaid Other | Attending: Obstetrics | Admitting: Obstetrics

## 2013-08-27 ENCOUNTER — Encounter (HOSPITAL_COMMUNITY): Payer: Self-pay

## 2013-08-27 ENCOUNTER — Inpatient Hospital Stay (HOSPITAL_COMMUNITY): Payer: Medicaid Other

## 2013-08-27 DIAGNOSIS — W19XXXA Unspecified fall, initial encounter: Secondary | ICD-10-CM

## 2013-08-27 DIAGNOSIS — R109 Unspecified abdominal pain: Secondary | ICD-10-CM

## 2013-08-27 DIAGNOSIS — R1084 Generalized abdominal pain: Secondary | ICD-10-CM

## 2013-08-27 DIAGNOSIS — O26899 Other specified pregnancy related conditions, unspecified trimester: Secondary | ICD-10-CM

## 2013-08-27 DIAGNOSIS — N760 Acute vaginitis: Secondary | ICD-10-CM | POA: Insufficient documentation

## 2013-08-27 DIAGNOSIS — O99891 Other specified diseases and conditions complicating pregnancy: Secondary | ICD-10-CM

## 2013-08-27 DIAGNOSIS — B9689 Other specified bacterial agents as the cause of diseases classified elsewhere: Secondary | ICD-10-CM | POA: Insufficient documentation

## 2013-08-27 DIAGNOSIS — Z87891 Personal history of nicotine dependence: Secondary | ICD-10-CM | POA: Insufficient documentation

## 2013-08-27 DIAGNOSIS — Y999 Unspecified external cause status: Secondary | ICD-10-CM | POA: Insufficient documentation

## 2013-08-27 DIAGNOSIS — W108XXA Fall (on) (from) other stairs and steps, initial encounter: Secondary | ICD-10-CM | POA: Insufficient documentation

## 2013-08-27 DIAGNOSIS — O368190 Decreased fetal movements, unspecified trimester, not applicable or unspecified: Secondary | ICD-10-CM | POA: Insufficient documentation

## 2013-08-27 DIAGNOSIS — A499 Bacterial infection, unspecified: Secondary | ICD-10-CM | POA: Insufficient documentation

## 2013-08-27 DIAGNOSIS — O9989 Other specified diseases and conditions complicating pregnancy, childbirth and the puerperium: Secondary | ICD-10-CM

## 2013-08-27 DIAGNOSIS — Y92009 Unspecified place in unspecified non-institutional (private) residence as the place of occurrence of the external cause: Secondary | ICD-10-CM | POA: Insufficient documentation

## 2013-08-27 DIAGNOSIS — O2390 Unspecified genitourinary tract infection in pregnancy, unspecified trimester: Secondary | ICD-10-CM | POA: Insufficient documentation

## 2013-08-27 LAB — URINALYSIS, ROUTINE W REFLEX MICROSCOPIC
BILIRUBIN URINE: NEGATIVE
Glucose, UA: NEGATIVE mg/dL
HGB URINE DIPSTICK: NEGATIVE
KETONES UR: 15 mg/dL — AB
Leukocytes, UA: NEGATIVE
Nitrite: NEGATIVE
Protein, ur: NEGATIVE mg/dL
SPECIFIC GRAVITY, URINE: 1.02 (ref 1.005–1.030)
UROBILINOGEN UA: 1 mg/dL (ref 0.0–1.0)
pH: 6.5 (ref 5.0–8.0)

## 2013-08-27 LAB — WET PREP, GENITAL
TRICH WET PREP: NONE SEEN
Yeast Wet Prep HPF POC: NONE SEEN

## 2013-08-27 MED ORDER — OXYCODONE-ACETAMINOPHEN 5-325 MG PO TABS
1.0000 | ORAL_TABLET | Freq: Once | ORAL | Status: AC
  Administered 2013-08-27: 1 via ORAL
  Filled 2013-08-27: qty 1

## 2013-08-27 MED ORDER — CYCLOBENZAPRINE HCL 5 MG PO TABS: 5.0000 mg | ORAL_TABLET | Freq: Three times a day (TID) | ORAL | Status: DC | PRN

## 2013-08-27 MED ORDER — ACETAMINOPHEN 500 MG PO TABS
1000.0000 mg | ORAL_TABLET | Freq: Once | ORAL | Status: AC
  Administered 2013-08-27: 1000 mg via ORAL
  Filled 2013-08-27: qty 2

## 2013-08-27 MED ORDER — METRONIDAZOLE 500 MG PO TABS: 500.0000 mg | ORAL_TABLET | Freq: Two times a day (BID) | ORAL | Status: DC

## 2013-08-27 MED ORDER — CYCLOBENZAPRINE HCL 5 MG PO TABS
5.0000 mg | ORAL_TABLET | Freq: Once | ORAL | Status: AC
  Administered 2013-08-27: 5 mg via ORAL
  Filled 2013-08-27: qty 1

## 2013-08-27 NOTE — MAU Note (Signed)
Pt states still having pain, no improvement from medication.

## 2013-08-27 NOTE — MAU Note (Signed)
Pt states warmed up food and was taking it upstairs. Food was too hot, pt leaned over to blow on food as she was walking upstairs and missed a step. Did note bleeding when wiping, however none today. Is having constant lower abdominal pain.

## 2013-08-27 NOTE — MAU Note (Signed)
Pt presents with complaints of falling up stairs yesterday and she landed on her abdomen. She states she has not felt fetal movement since that happened

## 2013-08-27 NOTE — MAU Provider Note (Signed)
History     CSN: [id number]  Arrival date and time: 08/27/13 1229   First Provider Initiated Contact with Patient 08/27/13 1310      Chief Complaint  Patient presents with  . Decreased Fetal Movement  . Fall   HPI  Ms. Cheyenne Hahn is a 23 y.o. female who presents to MAU following a fall at home. She was going up the stairs at home while holding food, she missed a step and landed knee first. She feels like she hit her stomach, but did not hit her back. Her mother witnessed the fall. She is scheduled to see Dr. Clearance CootsHarper in one week; this is her first prenatal visit.  She has presented to MAU previously with bleeding in pregnancy; diagnosed with friable cervix. She denies bleeding at this time. She currently rates her pain 8/10.   OB History   Grav Para Term Preterm Abortions TAB SAB Ect Mult Living   2 1 1  0 0 0 0 0 0 1      Past Medical History  Diagnosis Date  . No pertinent past medical history   . Pregnancy induced hypertension   . Anemia     Past Surgical History  Procedure Laterality Date  . Wisdom tooth extraction    . No past surgeries      Family History  Problem Relation Age of Onset  . Hypertension Mother   . Hypertension Maternal Aunt   . Hypertension Maternal Uncle   . Hypertension Maternal Grandmother   . Diabetes Maternal Grandmother     History  Substance Use Topics  . Smoking status: Former Games developermoker  . Smokeless tobacco: Never Used     Comment: 2011  . Alcohol Use: No    Allergies: No Known Allergies  Prescriptions prior to admission  Medication Sig Dispense Refill  . ondansetron (ZOFRAN ODT) 4 MG disintegrating tablet Take 1 tablet (4 mg total) by mouth every 8 (eight) hours as needed for nausea or vomiting.  20 tablet  0  . promethazine (PHENERGAN) 25 MG tablet Take 1 tablet (25 mg total) by mouth every 6 (six) hours as needed for nausea or vomiting.  30 tablet  0   Results for orders placed during the hospital encounter of 08/27/13  (from the past 48 hour(s))  URINALYSIS, ROUTINE W REFLEX MICROSCOPIC     Status: Abnormal   Collection Time    08/27/13 12:45 PM      Result Value Range   Color, Urine YELLOW  YELLOW   APPearance CLEAR  CLEAR   Specific Gravity, Urine 1.020  1.005 - 1.030   pH 6.5  5.0 - 8.0   Glucose, UA NEGATIVE  NEGATIVE mg/dL   Hgb urine dipstick NEGATIVE  NEGATIVE   Bilirubin Urine NEGATIVE  NEGATIVE   Ketones, ur 15 (*) NEGATIVE mg/dL   Protein, ur NEGATIVE  NEGATIVE mg/dL   Urobilinogen, UA 1.0  0.0 - 1.0 mg/dL   Nitrite NEGATIVE  NEGATIVE   Leukocytes, UA NEGATIVE  NEGATIVE   Comment: MICROSCOPIC NOT DONE ON URINES WITH NEGATIVE PROTEIN, BLOOD, LEUKOCYTES, NITRITE, OR GLUCOSE <1000 mg/dL.  WET PREP, GENITAL     Status: Abnormal   Collection Time    08/27/13  1:25 PM      Result Value Range   Yeast Wet Prep HPF POC NONE SEEN  NONE SEEN   Trich, Wet Prep NONE SEEN  NONE SEEN   Clue Cells Wet Prep HPF POC MODERATE (*) NONE SEEN  WBC, Wet Prep HPF POC MODERATE (*) NONE SEEN   Comment: MANY BACTERIA SEEN     Review of Systems  Constitutional: Negative for fever and chills.  Gastrointestinal: Positive for abdominal pain. Negative for heartburn, nausea, vomiting, diarrhea and constipation.       Bilateral lower abdominal pain   Genitourinary: Negative for dysuria, urgency, frequency and hematuria.       No vaginal discharge. No vaginal bleeding. No dysuria.    Physical Exam   Blood pressure 112/71, pulse 120, temperature 98.2 F (36.8 C), temperature source Oral, resp. rate 18, height 5\' 3"  (1.6 m), weight 49.896 kg (110 lb), last menstrual period 04/25/2013.  Physical Exam  Constitutional: She is oriented to person, place, and time. She appears well-developed and well-nourished. No distress.  HENT:  Head: Normocephalic.  Eyes: Pupils are equal, round, and reactive to light.  Neck: Neck supple.  Respiratory: Effort normal.  GI: Soft. She exhibits no distension. There is  tenderness.  Bilateral lower abdominal tenderness  Middle abdominal tenderness   Genitourinary: Vaginal discharge found.  Speculum exam: Vagina - Small amount of creamy, white/brown discharge, no odor Cervix - No contact bleeding, no active bleeding  Bimanual exam: Cervix FT, posterior  Uterus non tender, gravid  Adnexa non tender, no masses bilaterally Wet prep done Chaperone present for exam.   Musculoskeletal: Normal range of motion.  Neurological: She is alert and oriented to person, place, and time.  Skin: Skin is warm. She is not diaphoretic.  Psychiatric: Her behavior is normal.    MAU Course  Procedures None   MDM  + fetal heart tones by doppler 160 bpm  Tylenol 1 gram in MAU  Flexeril 5 mg PO   Following Flexeril and tylenol patient continued to rate her pain 7/10. [place] order to access placenta and fetal well being following fall.  1 percocet given in MAU  Preliminary [place] report shows placenta anterior, above cervical os.   Assessment and Plan   A:  Bacterial vaginosis Fall at home  P:  Discharge home in stable condition Follow up with PCP RX: Flagyl        Flexeril  Return to MAU if symptoms worsen  Limited activity for a few days Return with any bleeding  Iona Hansen [name], NP  08/27/2013, 6:42 PM

## 2013-08-27 NOTE — Discharge Instructions (Signed)
Abdominal Pain During Pregnancy °Abdominal pain is common in pregnancy. Most of the time, it does not cause harm. There are many causes of abdominal pain. Some causes are more serious than others. Some of the causes of abdominal pain in pregnancy are easily diagnosed. Occasionally, the diagnosis takes time to understand. Other times, the cause is not determined. Abdominal pain can be a sign that something is very wrong with the pregnancy, or the pain may have nothing to do with the pregnancy at all. For this reason, always tell your health care provider if you have any abdominal discomfort. °HOME CARE INSTRUCTIONS  °Monitor your abdominal pain for any changes. The following actions may help to alleviate any discomfort you are experiencing: °· Do not have sexual intercourse or put anything in your vagina until your symptoms go away completely. °· Get plenty of rest until your pain improves. °· Drink clear fluids if you feel nauseous. Avoid solid food as long as you are uncomfortable or nauseous. °· Only take over-the-counter or prescription medicine as directed by your health care provider. °· Keep all follow-up appointments with your health care provider. °SEEK IMMEDIATE MEDICAL CARE IF: °· You are bleeding, leaking fluid, or passing tissue from the vagina. °· You have increasing pain or cramping. °· You have persistent vomiting. °· You have painful or bloody urination. °· You have a fever. °· You notice a decrease in your baby's movements. °· You have extreme weakness or feel faint. °· You have shortness of breath, with or without abdominal pain. °· You develop a severe headache with abdominal pain. °· You have abnormal vaginal discharge with abdominal pain. °· You have persistent diarrhea. °· You have abdominal pain that continues even after rest, or gets worse. °MAKE SURE YOU:  °· Understand these instructions. °· Will watch your condition. °· Will get help right away if you are not doing well or get  worse. °Document Released: 07/20/2005 Document Revised: 05/10/2013 Document Reviewed: 02/16/2013 °ExitCare® Patient Information ©2014 [employer]. ° °

## 2013-08-28 ENCOUNTER — Encounter: Payer: Self-pay | Admitting: Obstetrics

## 2013-09-26 ENCOUNTER — Encounter: Payer: Self-pay | Admitting: Obstetrics

## 2013-09-26 ENCOUNTER — Ambulatory Visit (INDEPENDENT_AMBULATORY_CARE_PROVIDER_SITE_OTHER): Payer: Medicaid Other | Admitting: Obstetrics

## 2013-09-26 VITALS — BP 107/70 | Wt 117.0 lb

## 2013-09-26 DIAGNOSIS — Z113 Encounter for screening for infections with a predominantly sexual mode of transmission: Secondary | ICD-10-CM

## 2013-09-26 DIAGNOSIS — Z3201 Encounter for pregnancy test, result positive: Secondary | ICD-10-CM

## 2013-09-26 DIAGNOSIS — Z1389 Encounter for screening for other disorder: Secondary | ICD-10-CM

## 2013-09-26 DIAGNOSIS — Z3480 Encounter for supervision of other normal pregnancy, unspecified trimester: Secondary | ICD-10-CM

## 2013-09-26 LAB — POCT URINALYSIS DIPSTICK
Bilirubin, UA: NEGATIVE
Blood, UA: NEGATIVE
Glucose, UA: NEGATIVE
KETONES UA: NEGATIVE
LEUKOCYTES UA: NEGATIVE
Nitrite, UA: NEGATIVE
Spec Grav, UA: 1.015
UROBILINOGEN UA: NEGATIVE
pH, UA: 6.5

## 2013-09-26 NOTE — Progress Notes (Signed)
Pulse 105 Pt [facility] for [facility].  U/s performed with results WNL.

## 2013-09-27 ENCOUNTER — Encounter: Payer: Self-pay | Admitting: Obstetrics

## 2013-09-27 LAB — WET PREP BY MOLECULAR PROBE
Candida species: NEGATIVE
Gardnerella vaginalis: POSITIVE — AB
TRICHOMONAS VAG: NEGATIVE

## 2013-09-27 LAB — PAP IG W/ RFLX HPV ASCU

## 2013-09-27 LAB — CULTURE, OB URINE
Colony Count: NO GROWTH
Organism ID, Bacteria: NO GROWTH

## 2013-09-29 LAB — GC/CHLAMYDIA PROBE AMP
CT Probe RNA: NEGATIVE
GC PROBE AMP APTIMA: NEGATIVE

## 2013-10-03 ENCOUNTER — Other Ambulatory Visit: Payer: Self-pay | Admitting: *Deleted

## 2013-10-03 DIAGNOSIS — N760 Acute vaginitis: Principal | ICD-10-CM

## 2013-10-03 DIAGNOSIS — B9689 Other specified bacterial agents as the cause of diseases classified elsewhere: Secondary | ICD-10-CM

## 2013-10-03 MED ORDER — METRONIDAZOLE 500 MG PO TABS: 500.0000 mg | ORAL_TABLET | Freq: Two times a day (BID) | ORAL | Status: DC

## 2013-10-09 ENCOUNTER — Encounter (HOSPITAL_COMMUNITY): Payer: Self-pay | Admitting: *Deleted

## 2013-10-09 ENCOUNTER — Inpatient Hospital Stay (HOSPITAL_COMMUNITY)
Admission: AD | Admit: 2013-10-09 | Discharge: 2013-10-10 | Disposition: A | Discharge status: Home / Self Care | Payer: Medicaid Other | Source: Ambulatory Visit | Attending: Obstetrics | Admitting: Obstetrics

## 2013-10-09 DIAGNOSIS — Z87891 Personal history of nicotine dependence: Secondary | ICD-10-CM | POA: Insufficient documentation

## 2013-10-09 DIAGNOSIS — A088 Other specified intestinal infections: Secondary | ICD-10-CM

## 2013-10-09 DIAGNOSIS — O9989 Other specified diseases and conditions complicating pregnancy, childbirth and the puerperium: Principal | ICD-10-CM

## 2013-10-09 DIAGNOSIS — A084 Viral intestinal infection, unspecified: Secondary | ICD-10-CM

## 2013-10-09 DIAGNOSIS — O99891 Other specified diseases and conditions complicating pregnancy: Secondary | ICD-10-CM | POA: Insufficient documentation

## 2013-10-09 LAB — URINALYSIS, ROUTINE W REFLEX MICROSCOPIC
Bilirubin Urine: NEGATIVE
Glucose, UA: NEGATIVE mg/dL
Hgb urine dipstick: NEGATIVE
Ketones, ur: 15 mg/dL — AB
Leukocytes, UA: NEGATIVE
NITRITE: NEGATIVE
Protein, ur: NEGATIVE mg/dL
Specific Gravity, Urine: >1.03 — ABNORMAL HIGH (ref 1.005–1.030)
Urobilinogen, UA: 0.2 mg/dL (ref 0.0–1.0)
pH: 6 (ref 5.0–8.0)

## 2013-10-09 MED ORDER — PROMETHAZINE HCL 25 MG PO TABS: 12.5000 mg | ORAL_TABLET | Freq: Four times a day (QID) | ORAL | Status: DC | PRN

## 2013-10-09 MED ORDER — ONDANSETRON HCL 4 MG/2ML IJ SOLN
4.0000 mg | INTRAMUSCULAR | Status: AC
  Administered 2013-10-09: 4 mg via INTRAVENOUS
  Filled 2013-10-09: qty 2

## 2013-10-09 MED ORDER — LACTATED RINGERS IV BOLUS (SEPSIS)
1000.0000 mL | Freq: Once | INTRAVENOUS | Status: AC
  Administered 2013-10-09: 1000 mL via INTRAVENOUS

## 2013-10-09 MED ORDER — PROMETHAZINE HCL 25 MG/ML IJ SOLN
25.0000 mg | INTRAMUSCULAR | Status: AC
  Administered 2013-10-09: 25 mg via INTRAVENOUS
  Filled 2013-10-09: qty 1

## 2013-10-09 NOTE — MAU Provider Note (Signed)
Chief Complaint: Diarrhea and Abdominal Pain   First Provider Initiated Contact with Patient 10/09/13 2213     SUBJECTIVE HPI: Cheyenne Hahn is a 23 y.o. G2P1001 pt of Dr Clearance Coots at [redacted]w[redacted]d by 12 week ultrasound who presents to maternity admissions reporting sudden onset of abdominal pain, and n/v/d this morning at 3 am.  She went to work today but felt progressively worse. She came home and thought if she laid down she might feel better but did not improve.  She reports she has not kept down much food or fluid today.  She reports good fetal movement, denies LOF, vaginal bleeding, vaginal itching/burning, urinary symptoms, h/a, dizziness, or fever/chills.     Past Medical History  Diagnosis Date  . No pertinent past medical history   . Pregnancy induced hypertension   . Anemia    Past Surgical History  Procedure Laterality Date  . Wisdom tooth extraction    . No past surgeries     History   Social History  . Marital Status: Single    Spouse Name: N/A    Number of Children: N/A  . Years of Education: N/A   Occupational History  . Not on file.   Social History Main Topics  . Smoking status: Former Games developer  . Smokeless tobacco: Never Used     Comment: 2011  . Alcohol Use: No  . Drug Use: No  . Sexual Activity: Yes    Birth Control/ Protection: None   Other Topics Concern  . Not on file   Social History Narrative  . No narrative on file   No current facility-administered medications on file prior to encounter.   Current Outpatient Prescriptions on File Prior to Encounter  Medication Sig Dispense Refill  . metroNIDAZOLE (FLAGYL) 500 MG tablet Take 1 tablet (500 mg total) by mouth 2 (two) times daily.  14 tablet  0  . Prenatal Vit-Fe Fumarate-FA (PRENATAL MULTIVITAMIN) TABS tablet Take 1 tablet by mouth daily at 12 noon.       No Known Allergies  ROS: Pertinent items in HPI  OBJECTIVE Blood pressure 109/63, pulse 109, temperature 99.3 F (37.4 C), temperature source  Oral, resp. rate 18, height 5\' 2"  (1.575 m), weight 53.071 kg (117 lb), last menstrual period 04/25/2013, SpO2 100.00%. GENERAL: Well-developed, well-nourished female in mild distress.  HEENT: Normocephalic HEART: normal rate RESP: normal effort ABDOMEN: Soft, non-tender, gravid appropriate for gestational age EXTREMITIES: Nontender, no edema NEURO: Alert and oriented  FHT 160  FHR tracing obtained when EDC indicated she was 23 weeks but EDC adjusted r/t ultrasound making pt [redacted]w[redacted]d today.   Dilation: Closed Effacement (%): Thick Cervical Position: Posterior Exam by:: L. Leftwich-Kirby CNM  LAB RESULTS Results for orders placed during the hospital encounter of 10/09/13 (from the past 24 hour(s))  URINALYSIS, ROUTINE W REFLEX MICROSCOPIC     Status: Abnormal   Collection Time    10/09/13  9:10 PM      Result Value Ref Range   Color, Urine YELLOW  YELLOW   APPearance CLEAR  CLEAR   Specific Gravity, Urine >1.030 (*) 1.005 - 1.030   pH 6.0  5.0 - 8.0   Glucose, UA NEGATIVE  NEGATIVE mg/dL   Hgb urine dipstick NEGATIVE  NEGATIVE   Bilirubin Urine NEGATIVE  NEGATIVE   Ketones, ur 15 (*) NEGATIVE mg/dL   Protein, ur NEGATIVE  NEGATIVE mg/dL   Urobilinogen, UA 0.2  0.0 - 1.0 mg/dL   Nitrite NEGATIVE  NEGATIVE  Leukocytes, UA NEGATIVE  NEGATIVE    ASSESSMENT 1. Viral gastroenteritis     PLAN LR x1000 ml, Phenergan 25 mg IV, Zofran 4 mg IV--pt reports improvement in symptoms Discharge home Phenergan 12.5-25 mg PO Q 6 hours.  Pt has Zofran PO at home but does not like it. May take OTC Imodium for diarrhea F/U with Dr Clearance CootsHarper Return to MAU as needed    Medication List         metroNIDAZOLE 500 MG tablet  Commonly known as:  FLAGYL  Take 1 tablet (500 mg total) by mouth 2 (two) times daily.     prenatal multivitamin Tabs tablet  Take 1 tablet by mouth daily at 12 noon.       Follow-up Information   Follow up with HARPER,CHARLES A, MD.   Specialty:  Obstetrics and  Gynecology   Contact information:   [address] [place] [id number] [phone]       Sharen CounterLisa Leftwich-Kirby Certified Nurse-Midwife 10/09/2013  11:40 PM

## 2013-10-09 NOTE — Discharge Instructions (Signed)
Viral Gastroenteritis Viral gastroenteritis is also known as stomach flu. This condition affects the stomach and intestinal tract. It can cause sudden diarrhea and vomiting. The illness typically lasts 3 to 8 days. Most people develop an immune response that eventually gets rid of the virus. While this natural response develops, the virus can make you quite ill. CAUSES  Many different viruses can cause gastroenteritis, such as rotavirus or noroviruses. You can catch one of these viruses by consuming contaminated food or water. You may also catch a virus by sharing utensils or other personal items with an infected person or by touching a contaminated surface. SYMPTOMS  The most common symptoms are diarrhea and vomiting. These problems can cause a severe loss of body fluids (dehydration) and a body salt (electrolyte) imbalance. Other symptoms may include:  Fever.  Headache.  Fatigue.  Abdominal pain. DIAGNOSIS  Your caregiver can usually diagnose viral gastroenteritis based on your symptoms and a physical exam. A stool sample may also be taken to test for the presence of viruses or other infections. TREATMENT  This illness typically goes away on its own. Treatments are aimed at rehydration. The most serious cases of viral gastroenteritis involve vomiting so severely that you are not able to keep fluids down. In these cases, fluids must be given through an intravenous line (IV). HOME CARE INSTRUCTIONS   Drink enough fluids to keep your urine clear or pale yellow. Drink small amounts of fluids frequently and increase the amounts as tolerated.  Ask your caregiver for specific rehydration instructions.  Avoid:  Foods high in sugar.  Alcohol.  Carbonated drinks.  Tobacco.  Juice.  Caffeine drinks.  Extremely hot or cold fluids.  Fatty, greasy foods.  Too much intake of anything at one time.  Dairy products until 24 to 48 hours after diarrhea stops.  You may consume probiotics.  Probiotics are active cultures of beneficial bacteria. They may lessen the amount and number of diarrheal stools in adults. Probiotics can be found in yogurt with active cultures and in supplements.  Wash your hands well to avoid spreading the virus.  Only take over-the-counter or prescription medicines for pain, discomfort, or fever as directed by your caregiver. Do not give aspirin to children. Antidiarrheal medicines are not recommended.  Ask your caregiver if you should continue to take your regular prescribed and over-the-counter medicines.  Keep all follow-up appointments as directed by your caregiver. SEEK IMMEDIATE MEDICAL CARE IF:   You are unable to keep fluids down.  You do not urinate at least once every 6 to 8 hours.  You develop shortness of breath.  You notice blood in your stool or vomit. This may look like coffee grounds.  You have abdominal pain that increases or is concentrated in one small area (localized).  You have persistent vomiting or diarrhea.  You have a fever.  The patient is a child younger than 3 months, and he or she has a fever.  The patient is a child older than 3 months, and he or she has a fever and persistent symptoms.  The patient is a child older than 3 months, and he or she has a fever and symptoms suddenly get worse.  The patient is a baby, and he or she has no tears when crying. MAKE SURE YOU:   Understand these instructions.  Will watch your condition.  Will get help right away if you are not doing well or get worse. Document Released: 07/20/2005 Document Revised: 10/12/2011 Document Reviewed: 05/06/2011   ExitCare Patient Information 2014 [employer].  

## 2013-10-09 NOTE — MAU Note (Signed)
Pt presents with complaint of diarrhea, lower abd pain. Denies bleeding, denies dysuria. Denies fever.

## 2013-10-10 ENCOUNTER — Ambulatory Visit (INDEPENDENT_AMBULATORY_CARE_PROVIDER_SITE_OTHER): Payer: Medicaid Other

## 2013-10-10 ENCOUNTER — Encounter: Payer: Self-pay | Admitting: Obstetrics

## 2013-10-10 ENCOUNTER — Other Ambulatory Visit: Payer: Medicaid Other

## 2013-10-10 ENCOUNTER — Ambulatory Visit (INDEPENDENT_AMBULATORY_CARE_PROVIDER_SITE_OTHER): Payer: Medicaid Other | Admitting: Obstetrics

## 2013-10-10 VITALS — BP 92/61 | Temp 97.7°F | Wt 121.0 lb

## 2013-10-10 DIAGNOSIS — Z1389 Encounter for screening for other disorder: Secondary | ICD-10-CM

## 2013-10-10 DIAGNOSIS — Z3480 Encounter for supervision of other normal pregnancy, unspecified trimester: Secondary | ICD-10-CM

## 2013-10-10 DIAGNOSIS — O365990 Maternal care for other known or suspected poor fetal growth, unspecified trimester, not applicable or unspecified: Secondary | ICD-10-CM

## 2013-10-10 LAB — POCT URINALYSIS DIPSTICK
BILIRUBIN UA: NEGATIVE
Blood, UA: NEGATIVE
Glucose, UA: NEGATIVE
Nitrite, UA: NEGATIVE
SPEC GRAV UA: 1.02
Urobilinogen, UA: NEGATIVE
pH, UA: 5

## 2013-10-10 NOTE — Progress Notes (Signed)
Pulse: 99 Patient states she is having lower abdominal, pelvic and back pain. Patient states she went to the hospital last night for the pain and diarrhea, they told her it could have been something viral. Patient denies any concerns.

## 2013-10-11 ENCOUNTER — Encounter: Payer: Self-pay | Admitting: Obstetrics

## 2013-10-11 LAB — US OB DETAIL + 14 WK

## 2013-10-11 LAB — OBSTETRIC PANEL
Antibody Screen: NEGATIVE
BASOS ABS: 0 10*3/uL (ref 0.0–0.1)
Basophils Relative: 0 % (ref 0–1)
Eosinophils Absolute: 0 10*3/uL (ref 0.0–0.7)
Eosinophils Relative: 1 % (ref 0–5)
HCT: 32 % — ABNORMAL LOW (ref 36.0–46.0)
Hemoglobin: 11 g/dL — ABNORMAL LOW (ref 12.0–15.0)
Hepatitis B Surface Ag: NEGATIVE
LYMPHS PCT: 15 % (ref 12–46)
Lymphs Abs: 0.7 10*3/uL (ref 0.7–4.0)
MCH: 30.8 pg (ref 26.0–34.0)
MCHC: 34.4 g/dL (ref 30.0–36.0)
MCV: 89.6 fL (ref 78.0–100.0)
Monocytes Absolute: 0.3 10*3/uL (ref 0.1–1.0)
Monocytes Relative: 6 % (ref 3–12)
NEUTROS ABS: 3.7 10*3/uL (ref 1.7–7.7)
Neutrophils Relative %: 78 % — ABNORMAL HIGH (ref 43–77)
PLATELETS: 201 10*3/uL (ref 150–400)
RBC: 3.57 MIL/uL — ABNORMAL LOW (ref 3.87–5.11)
RDW: 13.6 % (ref 11.5–15.5)
RUBELLA: 4.2 {index} — AB (ref <0.90)
Rh Type: POSITIVE
WBC: 4.7 10*3/uL (ref 4.0–10.5)

## 2013-10-11 LAB — GLUCOSE TOLERANCE, 2 HOURS W/ 1HR
GLUCOSE, 2 HOUR: 73 mg/dL (ref 70–139)
GLUCOSE, FASTING: 52 mg/dL — AB (ref 70–99)
Glucose, 1 hour: 79 mg/dL (ref 70–170)

## 2013-10-11 LAB — HIV ANTIBODY (ROUTINE TESTING W REFLEX): HIV: NONREACTIVE

## 2013-10-11 LAB — VARICELLA ZOSTER ANTIBODY, IGG: VARICELLA IGG: 2758 {index} — AB (ref <135.00)

## 2013-10-11 LAB — VITAMIN D 25 HYDROXY (VIT D DEFICIENCY, FRACTURES): VIT D 25 HYDROXY: 19 ng/mL — AB (ref 30–89)

## 2013-10-12 LAB — HEMOGLOBINOPATHY EVALUATION
HGB F QUANT: 0 % (ref 0.0–2.0)
HGB S QUANTITAION: 0 %
Hemoglobin Other: 0 %
Hgb A2 Quant: 3.1 % (ref 2.2–3.2)
Hgb A: 96.9 % (ref 96.8–97.8)

## 2013-10-30 ENCOUNTER — Encounter (HOSPITAL_COMMUNITY): Payer: Self-pay

## 2013-10-30 ENCOUNTER — Inpatient Hospital Stay (HOSPITAL_COMMUNITY)
Admission: AD | Admit: 2013-10-30 | Discharge: 2013-10-30 | Disposition: A | Discharge status: Home / Self Care | Payer: Medicaid Other | Source: Ambulatory Visit | Attending: Obstetrics & Gynecology | Admitting: Obstetrics & Gynecology

## 2013-10-30 ENCOUNTER — Inpatient Hospital Stay (HOSPITAL_COMMUNITY): Payer: Medicaid Other

## 2013-10-30 DIAGNOSIS — R109 Unspecified abdominal pain: Secondary | ICD-10-CM | POA: Insufficient documentation

## 2013-10-30 DIAGNOSIS — O4690 Antepartum hemorrhage, unspecified, unspecified trimester: Secondary | ICD-10-CM | POA: Insufficient documentation

## 2013-10-30 DIAGNOSIS — D649 Anemia, unspecified: Secondary | ICD-10-CM | POA: Insufficient documentation

## 2013-10-30 DIAGNOSIS — M549 Dorsalgia, unspecified: Secondary | ICD-10-CM | POA: Insufficient documentation

## 2013-10-30 DIAGNOSIS — Z87891 Personal history of nicotine dependence: Secondary | ICD-10-CM | POA: Insufficient documentation

## 2013-10-30 LAB — URINALYSIS, ROUTINE W REFLEX MICROSCOPIC
BILIRUBIN URINE: NEGATIVE
GLUCOSE, UA: NEGATIVE mg/dL
HGB URINE DIPSTICK: NEGATIVE
Ketones, ur: NEGATIVE mg/dL
Leukocytes, UA: NEGATIVE
Nitrite: NEGATIVE
Protein, ur: NEGATIVE mg/dL
SPECIFIC GRAVITY, URINE: 1.025 (ref 1.005–1.030)
Urobilinogen, UA: 1 mg/dL (ref 0.0–1.0)
pH: 6 (ref 5.0–8.0)

## 2013-10-30 LAB — WET PREP, GENITAL
Clue Cells Wet Prep HPF POC: NONE SEEN
TRICH WET PREP: NONE SEEN
YEAST WET PREP: NONE SEEN

## 2013-10-30 NOTE — MAU Note (Addendum)
Abd pain and back pain since 11 am. Vaginal bleeding at 3 pm, was just in underwear.  Reports very small amt now, bright red when just voided.  Denies leaking.  Reports good baby movement.

## 2013-10-30 NOTE — MAU Provider Note (Signed)
History    Cheyenne Hahn is a 23 yo G2P1001 at 24.5w reporting to the MAU for low back pain since 11 am. Vaginal bleeding first detected at 3 pm.  Bleeding described as medium amount of "period" colored bleeding, not bright red. She has changed her pad once in the lat 6 hrs. Patient denies contractions and LOF.  Reports good fetal movement.  Last intercourse >2 weeks ago. Ultrasound done at 15.4 wks confirms anterior placenta above the cervical os. Patient receives prenatal care at [facility].   CSN: [id number]  Arrival date and time: 10/30/13 4098   First Provider Initiated Contact with Patient 10/30/13 2041      Chief Complaint  Patient presents with  . Abdominal Pain  . Back Pain  . Vaginal Bleeding   HPI  OB History   Grav Para Term Preterm Abortions TAB SAB Ect Mult Living   2 1 1  0 0 0 0 0 0 1      Past Medical History  Diagnosis Date  . No pertinent past medical history   . Pregnancy induced hypertension   . Anemia     Past Surgical History  Procedure Laterality Date  . Wisdom tooth extraction    . No past surgeries      Family History  Problem Relation Age of Onset  . Hypertension Mother   . Hypertension Maternal Aunt   . Hypertension Maternal Uncle   . Hypertension Maternal Grandmother   . Diabetes Maternal Grandmother     History  Substance Use Topics  . Smoking status: Former Smoker    Quit date: 05/02/2012  . Smokeless tobacco: Never Used     Comment: 2011  . Alcohol Use: No    Allergies: No Known Allergies  Prescriptions prior to admission  Medication Sig Dispense Refill  . metroNIDAZOLE (FLAGYL) 500 MG tablet Take 1 tablet (500 mg total) by mouth 2 (two) times daily.  14 tablet  0  . Prenatal Vit-Fe Fumarate-FA (PRENATAL MULTIVITAMIN) TABS tablet Take 1 tablet by mouth daily at 12 noon.        Review of Systems  Constitutional: Negative.   HENT: Negative.   Eyes: Negative.   Respiratory: Negative.   Cardiovascular: Negative.    Gastrointestinal: Positive for abdominal pain.  Genitourinary:       Vaginal bleeding  Musculoskeletal: Positive for back pain.  Skin: Negative.   Neurological: Negative.   Endo/Heme/Allergies: Negative.   Psychiatric/Behavioral: Negative.    Physical Exam   Blood pressure 98/63, pulse 107, temperature 98.8 F (37.1 C), temperature source Oral, resp. rate 18, last menstrual period 04/25/2013, SpO2 100.00%.  Physical Exam  Constitutional: She is oriented to person, place, and time. She appears well-developed and well-nourished.  HENT:  Head: Normocephalic and atraumatic.  Eyes: Conjunctivae are normal.  Neck: Normal range of motion. Neck supple.  Cardiovascular: Normal rate and regular rhythm.   Respiratory: Effort normal.  GI: Soft.  Genitourinary: Vagina normal.  Gravid Uterus, Right adnexal tenderness.  Neurological: She is alert and oriented to person, place, and time.  Skin: Skin is warm.  Psychiatric: She has a normal mood and affect. Her behavior is normal. Judgment and thought content normal.   Dilation: Closed Effacement (%): Thick Station:  (high) Exam by:: Inetta Fermo, CNM student and Roney Marion CNM  FHR Baseline: 150 Variability: Moderate Accelerations: Absent Decelerations: Variables  Category 1 Tracing  MAU Course  Procedures  MDM Results for orders placed during the hospital encounter of 10/30/13 (from the past  24 hour(s))  URINALYSIS, ROUTINE W REFLEX MICROSCOPIC     Status: None   Collection Time    10/30/13  8:00 PM      Result Value Ref Range   Color, Urine YELLOW  YELLOW   APPearance CLEAR  CLEAR   Specific Gravity, Urine 1.025  1.005 - 1.030   pH 6.0  5.0 - 8.0   Glucose, UA NEGATIVE  NEGATIVE mg/dL   Hgb urine dipstick NEGATIVE  NEGATIVE   Bilirubin Urine NEGATIVE  NEGATIVE   Ketones, ur NEGATIVE  NEGATIVE mg/dL   Protein, ur NEGATIVE  NEGATIVE mg/dL   Urobilinogen, UA 1.0  0.0 - 1.0 mg/dL   Nitrite NEGATIVE  NEGATIVE   Leukocytes, UA  NEGATIVE  NEGATIVE  WET PREP, GENITAL     Status: Abnormal   Collection Time    10/30/13  9:00 PM      Result Value Ref Range   Yeast Wet Prep HPF POC NONE SEEN  NONE SEEN   Trich, Wet Prep NONE SEEN  NONE SEEN   Clue Cells Wet Prep HPF POC NONE SEEN  NONE SEEN   WBC, Wet Prep HPF POC FEW (*) NONE SEEN     Assessment and Plan  23 yo G2P1001 with [facility] @[redacted]w[redacted]d  Vaginal Bleeding   2200 Consulted with Dr. Gaynell FaceMarshall > reviewed HPI/exam > obtain ultrasound  Selena LesserBraimah, Tina 10/30/2013, 8:50 PM   I examined pt and agree with documentation above and nurse midwife student plan of care. [facility]  2200 Report given to V. Katrinka BlazingSmith who assumes care of patient.    No previa, CL 6 cm.   ASSESSMENT: 1. Vaginal bleeding in pregnancy    PLAN: D/C home in stable condition.  Bleeding precautions. Pelvic rest x 1 week.   Medication List    STOP taking these medications       metroNIDAZOLE 500 MG tablet  Commonly known as:  FLAGYL      TAKE these medications       prenatal multivitamin Tabs tablet  Take 1 tablet by mouth daily at 12 noon.       Follow-up Information   [facility] (Keep next scheduled appt on April 7th)    Contact information:   [address] [place] [id number] [phone]      Dorathy KinsmanVirginia Livana Yerian, [place] Island[place] 10/30/2013 11:34 PM

## 2013-10-30 NOTE — Discharge Instructions (Signed)
Vaginal Bleeding During Pregnancy, Second Trimester °A small amount of bleeding (spotting) from the vagina is relatively common in pregnancy. It usually stops on its own. Various things can cause bleeding or spotting in pregnancy. Some bleeding may be related to the pregnancy, and some may not. Sometimes the bleeding is normal and is not a problem. However, bleeding can also be a sign of something serious. Be sure to tell your health care provider about any vaginal bleeding right away. °Some possible causes of vaginal bleeding during the second trimester include: °· Infection, inflammation, or growths on the cervix.   °· The placenta may be partially or completely covering the opening of the cervix inside the uterus (placenta previa). °· The placenta may have separated from the uterus (abruption of the placenta).   °· You may be having early (preterm) labor.   °· The cervix may not be strong enough to keep a baby inside the uterus (cervical insufficiency).   °· Tiny cysts may have developed in the uterus instead of pregnancy tissue (molar pregnancy).  °HOME CARE INSTRUCTIONS  °Watch your condition for any changes. The following actions may help to lessen any discomfort you are feeling: °· Follow your health care provider's instructions for limiting your activity. If your health care provider orders bed rest, you may need to stay in bed and only get up to use the bathroom. However, your health care provider may allow you to continue light activity. °· If needed, make plans for someone to help with your regular activities and responsibilities while you are on bed rest. °· Keep track of the number of pads you use each day, how often you change pads, and how soaked (saturated) they are. Write this down. °· Do not use tampons. Do not douche. °· Do not have sexual intercourse or orgasms until approved by your health care provider. °· If you pass any tissue from your vagina, save the tissue so you can show it to your  health care provider. °· Only take over-the-counter or prescription medicines as directed by your health care provider. °· Do not take aspirin because it can make you bleed. °· Do not exercise or perform any strenuous activities or heavy lifting without your health care provider's permission. °· Keep all follow-up appointments as directed by your health care provider. °SEEK MEDICAL CARE IF: °· You have any vaginal bleeding during any part of your pregnancy. °· You have cramps or labor pains. °SEEK IMMEDIATE MEDICAL CARE IF:  °· You have severe cramps in your back or belly (abdomen). °· You have contractions. °· You have a fever, not controlled by medicine. °· You have chills. °· You pass large clots or tissue from your vagina. °· Your bleeding increases. °· You feel lightheaded or weak, or you have fainting episodes. °· You are leaking fluid or have a gush of fluid from your vagina. °MAKE SURE YOU: °· Understand these instructions. °· Will watch your condition. °· Will get help right away if you are not doing well or get worse. °Document Released: 04/29/2005 Document Revised: 05/10/2013 Document Reviewed: 03/27/2013 °Exit[employer]® Patient Information ©2014 [employer]. ° °

## 2013-10-31 LAB — GC/CHLAMYDIA PROBE AMP
CT Probe RNA: NEGATIVE
GC PROBE AMP APTIMA: NEGATIVE

## 2013-11-07 ENCOUNTER — Encounter: Payer: Medicaid Other | Admitting: Obstetrics

## 2013-11-07 ENCOUNTER — Ambulatory Visit (INDEPENDENT_AMBULATORY_CARE_PROVIDER_SITE_OTHER): Payer: Medicaid Other | Admitting: Obstetrics

## 2013-11-07 VITALS — BP 131/67 | Temp 98.4°F | Wt 122.0 lb

## 2013-11-07 DIAGNOSIS — Z3480 Encounter for supervision of other normal pregnancy, unspecified trimester: Secondary | ICD-10-CM

## 2013-11-07 DIAGNOSIS — J309 Allergic rhinitis, unspecified: Secondary | ICD-10-CM

## 2013-11-07 LAB — POCT URINALYSIS DIPSTICK
Bilirubin, UA: NEGATIVE
Glucose, UA: NEGATIVE
Leukocytes, UA: NEGATIVE
NITRITE UA: NEGATIVE
PH UA: 5
RBC UA: NEGATIVE
SPEC GRAV UA: 1.02
UROBILINOGEN UA: NEGATIVE

## 2013-11-07 MED ORDER — OMEPRAZOLE 20 MG PO CPDR: 20.0000 mg | DELAYED_RELEASE_CAPSULE | Freq: Two times a day (BID) | ORAL | Status: DC

## 2013-11-07 MED ORDER — LORATADINE 10 MG PO TABS: 10.0000 mg | ORAL_TABLET | Freq: Every day | ORAL | Status: DC

## 2013-11-07 NOTE — Progress Notes (Signed)
Pulse 66 Pt is doing well.

## 2013-11-14 ENCOUNTER — Encounter: Payer: Medicaid Other | Admitting: Obstetrics

## 2013-11-20 ENCOUNTER — Encounter: Payer: Self-pay | Admitting: Obstetrics

## 2013-11-23 ENCOUNTER — Ambulatory Visit (INDEPENDENT_AMBULATORY_CARE_PROVIDER_SITE_OTHER): Payer: Medicaid Other | Admitting: Obstetrics

## 2013-11-23 VITALS — BP 111/64 | HR 86 | Temp 97.8°F | Wt 126.0 lb

## 2013-11-23 DIAGNOSIS — Z3480 Encounter for supervision of other normal pregnancy, unspecified trimester: Secondary | ICD-10-CM

## 2013-11-23 LAB — POCT URINALYSIS DIPSTICK
Bilirubin, UA: NEGATIVE
Glucose, UA: NEGATIVE
KETONES UA: NEGATIVE
Leukocytes, UA: NEGATIVE
Nitrite, UA: NEGATIVE
RBC UA: NEGATIVE
Spec Grav, UA: 1.02
UROBILINOGEN UA: NEGATIVE
pH, UA: 5

## 2013-11-24 ENCOUNTER — Encounter: Payer: Self-pay | Admitting: Obstetrics

## 2013-11-24 NOTE — Progress Notes (Signed)
Subjective:    Cheyenne Hahn is a 23 y.o. female being seen today for her obstetrical visit. She is at 517w2d gestation. Patient reports no complaints. Fetal movement: normal.   Past Medical History  Diagnosis Date  . No pertinent past medical history   . Pregnancy induced hypertension   . Anemia     Past Surgical History  Procedure Laterality Date  . Wisdom tooth extraction    . No past surgeries      Current outpatient prescriptions:Prenatal Vit-Fe Fumarate-FA (PRENATAL MULTIVITAMIN) TABS tablet, Take 1 tablet by mouth daily at 12 noon., Disp: , Rfl: ;  loratadine (CLARITIN) 10 MG tablet, Take 1 tablet (10 mg total) by mouth daily., Disp: 30 tablet, Rfl: 11;  omeprazole (PRILOSEC) 20 MG capsule, Take 1 capsule (20 mg total) by mouth 2 (two) times daily at 8 am and 10 pm., Disp: 60 capsule, Rfl: 11 No Known Allergies  History  Substance Use Topics  . Smoking status: Former Smoker    Quit date: 05/02/2012  . Smokeless tobacco: Never Used     Comment: 2011  . Alcohol Use: No    Family History  Problem Relation Age of Onset  . Hypertension Mother   . Hypertension Maternal Aunt   . Hypertension Maternal Uncle   . Hypertension Maternal Grandmother   . Diabetes Maternal Grandmother      Review of Systems Constitutional: negative for anorexia Gastrointestinal: negative for abdominal pain Genitourinary:negative for vaginal discharge Musculoskeletal:negative for back pain Behavioral/Psych: negative for depression and tobacco use   Objective:    BP 111/64  Pulse 86  Temp(Src) 97.8 F (36.6 C)  Wt 126 lb (57.153 kg)  LMP 04/25/2013 FHT:  140 BPM  Uterine Size: size equals dates  Presentation: unsure     Assessment:    Pregnancy @ 398w2d weeks   Plan:     labs reviewed, problem list updated Consent signed. GBS sent TDAP offered  Rhogam given for RH negative Pediatrician: discussed. Infant feeding: plans to breastfeed. Maternity leave: n/a. Cigarette smoking:  quit date 05-02-12. Orders Placed This Encounter  Procedures  . POCT urinalysis dipstick   No orders of the defined types were placed in this encounter.   Follow up in 2 Weeks.

## 2013-11-25 ENCOUNTER — Inpatient Hospital Stay (HOSPITAL_COMMUNITY): Admission: AD | Admit: 2013-11-25 | Payer: Medicaid Other | Source: Ambulatory Visit | Admitting: Obstetrics

## 2013-12-07 ENCOUNTER — Ambulatory Visit (INDEPENDENT_AMBULATORY_CARE_PROVIDER_SITE_OTHER): Payer: Medicaid Other | Admitting: Obstetrics

## 2013-12-07 ENCOUNTER — Encounter: Payer: Self-pay | Admitting: Obstetrics

## 2013-12-07 VITALS — BP 108/72 | HR 101 | Temp 98.4°F | Wt 129.0 lb

## 2013-12-07 DIAGNOSIS — Z3480 Encounter for supervision of other normal pregnancy, unspecified trimester: Secondary | ICD-10-CM

## 2013-12-07 LAB — POCT URINALYSIS DIPSTICK
Blood, UA: NEGATIVE
Glucose, UA: NEGATIVE
Ketones, UA: NEGATIVE
LEUKOCYTES UA: NEGATIVE
Nitrite, UA: NEGATIVE
PH UA: 6
PROTEIN UA: NEGATIVE
SPEC GRAV UA: 1.015

## 2013-12-07 NOTE — Progress Notes (Signed)
Subjective:    Cheyenne Hahn is a 23 y.o. female being seen today for her obstetrical visit. She is at 3655w1d gestation. Patient reports pressure. Fetal movement: normal.  Problem List Items Addressed This Visit   None    Visit Diagnoses   Supervision of other normal pregnancy    -  Primary    Relevant Orders       POCT urinalysis dipstick (Completed)      Patient Active Problem List   Diagnosis Date Noted  . Normal pregnancy 03/11/2011  . Decreased fetal movement 03/11/2011   Objective:    BP 108/72  Pulse 101  Temp(Src) 98.4 F (36.9 C)  Wt 129 lb (58.514 kg)  LMP 04/25/2013 FHT:  150 BPM  Uterine Size: size equals dates  Presentation: unsure     Assessment:    Pregnancy @ 4755w1d weeks   Plan:     labs reviewed, problem list updated Consent signed. GBS sent TDAP offered  Rhogam given for RH negative Pediatrician: discussed. Infant feeding: plans to breastfeed. Maternity leave: not discussed. Cigarette smoking: quit date 05-02-12. Orders Placed This Encounter  Procedures  . POCT urinalysis dipstick   No orders of the defined types were placed in this encounter.   Follow up in 2 Weeks.

## 2013-12-21 ENCOUNTER — Encounter: Payer: Self-pay | Admitting: Obstetrics

## 2013-12-21 ENCOUNTER — Ambulatory Visit (INDEPENDENT_AMBULATORY_CARE_PROVIDER_SITE_OTHER): Payer: Medicaid Other | Admitting: Obstetrics

## 2013-12-21 VITALS — BP 114/74 | HR 97 | Temp 97.6°F | Wt 137.0 lb

## 2013-12-21 DIAGNOSIS — Z3480 Encounter for supervision of other normal pregnancy, unspecified trimester: Secondary | ICD-10-CM

## 2013-12-21 LAB — POCT URINALYSIS DIPSTICK
Blood, UA: NEGATIVE
GLUCOSE UA: NEGATIVE
Ketones, UA: NEGATIVE
NITRITE UA: NEGATIVE
PH UA: 7
Spec Grav, UA: 1.01
Urobilinogen, UA: 2

## 2013-12-21 NOTE — Progress Notes (Signed)
Subjective:    Cheyenne Hahn is a 23 y.o. female being seen today for her obstetrical visit. She is at 6182w1d gestation. Patient reports no complaints. Fetal movement: normal.  Problem List Items Addressed This Visit   None    Visit Diagnoses   Supervision of other normal pregnancy    -  Primary    Relevant Orders       POCT urinalysis dipstick (Completed)      Patient Active Problem List   Diagnosis Date Noted  . Normal pregnancy 03/11/2011  . Decreased fetal movement 03/11/2011   Objective:    BP 114/74  Pulse 97  Temp(Src) 97.6 F (36.4 C)  Wt 137 lb (62.143 kg)  LMP 04/25/2013 FHT:  140 BPM  Uterine Size: size equals dates  Presentation: unsure     Assessment:    Pregnancy @ 6782w1d weeks   Plan:     labs reviewed, problem list updated Consent signed. GBS sent TDAP offered  Rhogam given for RH negative Pediatrician: discussed. Infant feeding: plans to breastfeed. Maternity leave: not discussed. Cigarette smoking: quit date 05-02-12. Orders Placed This Encounter  Procedures  . POCT urinalysis dipstick   No orders of the defined types were placed in this encounter.   Follow up in 2 Weeks.

## 2014-01-04 ENCOUNTER — Encounter: Payer: Medicaid Other | Admitting: Obstetrics

## 2014-01-08 ENCOUNTER — Ambulatory Visit (INDEPENDENT_AMBULATORY_CARE_PROVIDER_SITE_OTHER): Payer: Medicaid Other | Admitting: Obstetrics

## 2014-01-08 VITALS — BP 112/69 | HR 99 | Temp 97.9°F | Wt 135.0 lb

## 2014-01-08 DIAGNOSIS — R519 Headache, unspecified: Secondary | ICD-10-CM

## 2014-01-08 DIAGNOSIS — Z3400 Encounter for supervision of normal first pregnancy, unspecified trimester: Secondary | ICD-10-CM

## 2014-01-08 DIAGNOSIS — R51 Headache: Secondary | ICD-10-CM

## 2014-01-08 LAB — POCT URINALYSIS DIPSTICK
BILIRUBIN UA: NEGATIVE
Blood, UA: NEGATIVE
Glucose, UA: NEGATIVE
Ketones, UA: NEGATIVE
LEUKOCYTES UA: NEGATIVE
NITRITE UA: NEGATIVE
Spec Grav, UA: 1.02
UROBILINOGEN UA: 1
pH, UA: 5.5

## 2014-01-08 LAB — OB RESULTS CONSOLE GC/CHLAMYDIA
CHLAMYDIA, DNA PROBE: NEGATIVE
GC PROBE AMP, GENITAL: NEGATIVE

## 2014-01-08 MED ORDER — BUTALBITAL-APAP-CAFFEINE 50-325-40 MG PO TABS: 2.0000 | ORAL_TABLET | Freq: Four times a day (QID) | ORAL | Status: DC | PRN

## 2014-01-09 ENCOUNTER — Encounter: Payer: Self-pay | Admitting: Obstetrics

## 2014-01-09 LAB — WET PREP BY MOLECULAR PROBE
CANDIDA SPECIES: NEGATIVE
GARDNERELLA VAGINALIS: POSITIVE — AB
TRICHOMONAS VAG: NEGATIVE

## 2014-01-09 LAB — GC/CHLAMYDIA PROBE AMP
CT Probe RNA: NEGATIVE
GC Probe RNA: NEGATIVE

## 2014-01-09 NOTE — Progress Notes (Signed)
Subjective:    Cheyenne Hahn is a 23 y.o. female being seen today for her obstetrical visit. She is at [redacted]w[redacted]d gestation. Patient reports no complaints. Fetal movement: normal.  Problem List Items Addressed This Visit   None    Visit Diagnoses   Supervision of normal first pregnancy    -  Primary    Relevant Orders       POCT urinalysis dipstick (Completed)       WET PREP BY MOLECULAR PROBE (Completed)       GC/Chlamydia Probe Amp       Strep B DNA probe    HA (headache)        Relevant Medications       butalbital-acetaminophen-caffeine (FIORICET) tablet 50-325-40 mg       Patient Active Problem List   Diagnosis Date Noted  . Normal pregnancy 03/11/2011  . Decreased fetal movement 03/11/2011   Objective:    BP 112/69  Pulse 99  Temp(Src) 97.9 F (36.6 C)  Wt 135 lb (61.236 kg)  LMP 04/25/2013 FHT:  140  BPM  Uterine Size: size equals dates  Presentation: unsure     Assessment:    Pregnancy @ [redacted]w[redacted]d weeks   Plan:     labs reviewed, problem list updated Consent signed. GBS sent TDAP offered  Rhogam given for RH negative Pediatrician: discussed. Infant feeding: plans to breastfeed. Maternity leave: not discussed. Cigarette smoking: quit date 04-15-12. Orders Placed This Encounter  Procedures  . WET PREP BY MOLECULAR PROBE  . GC/Chlamydia Probe Amp  . Strep B DNA probe  . POCT urinalysis dipstick   Meds ordered this encounter  Medications  . butalbital-acetaminophen-caffeine (FIORICET) 50-325-40 MG per tablet    Sig: Take 2 tablets by mouth every 6 (six) hours as needed for headache.    Dispense:  40 tablet    Refill:  2   Follow up in 1 Week.

## 2014-01-10 LAB — STREP B DNA PROBE: GBSP: DETECTED

## 2014-01-16 ENCOUNTER — Other Ambulatory Visit: Payer: Self-pay | Admitting: *Deleted

## 2014-01-16 ENCOUNTER — Encounter: Payer: Medicaid Other | Admitting: Obstetrics

## 2014-01-16 DIAGNOSIS — B9689 Other specified bacterial agents as the cause of diseases classified elsewhere: Secondary | ICD-10-CM

## 2014-01-16 DIAGNOSIS — N760 Acute vaginitis: Principal | ICD-10-CM

## 2014-01-16 MED ORDER — TINIDAZOLE 500 MG PO TABS: 1.0000 g | ORAL_TABLET | Freq: Every day | ORAL | Status: DC

## 2014-01-24 ENCOUNTER — Encounter: Payer: Medicaid Other | Admitting: Obstetrics

## 2014-01-24 ENCOUNTER — Ambulatory Visit (INDEPENDENT_AMBULATORY_CARE_PROVIDER_SITE_OTHER): Payer: Medicaid Other | Admitting: Obstetrics

## 2014-01-24 VITALS — BP 121/73 | HR 104 | Temp 98.3°F | Wt 141.0 lb

## 2014-01-24 DIAGNOSIS — Z3480 Encounter for supervision of other normal pregnancy, unspecified trimester: Secondary | ICD-10-CM

## 2014-01-24 DIAGNOSIS — Z3483 Encounter for supervision of other normal pregnancy, third trimester: Secondary | ICD-10-CM

## 2014-01-25 ENCOUNTER — Encounter: Payer: Self-pay | Admitting: Obstetrics

## 2014-01-25 LAB — POCT URINALYSIS DIPSTICK
GLUCOSE UA: NEGATIVE
KETONES UA: NEGATIVE
Leukocytes, UA: NEGATIVE
Nitrite, UA: NEGATIVE
PH UA: 7
RBC UA: NEGATIVE
SPEC GRAV UA: 1.01
Urobilinogen, UA: 1

## 2014-01-25 NOTE — Progress Notes (Signed)
Subjective:    Cheyenne Hahn is a 23 y.o. female being seen today for her obstetrical visit. She is at 6829w1d gestation. Patient reports no complaints. Fetal movement: normal.  Problem List Items Addressed This Visit   None    Visit Diagnoses   Encounter for supervision of other normal pregnancy in third trimester    -  Primary    Relevant Orders       POCT urinalysis dipstick      Patient Active Problem List   Diagnosis Date Noted  . Normal pregnancy 03/11/2011  . Decreased fetal movement 03/11/2011    Objective:    BP 121/73  Pulse 104  Temp(Src) 98.3 F (36.8 C)  Wt 141 lb (63.957 kg)  LMP 04/25/2013 FHT: 150 BPM  Uterine Size: size equals dates  Presentations: cephalic  Pelvic Exam: Deferred    Assessment:    Pregnancy @ 6229w1d weeks   Plan:   Plans for delivery: Vaginal anticipated; labs reviewed; problem list updated Counseling: Consent signed. Infant feeding: plans to breastfeed. Cigarette smoking:  Quit 9 / 2013. L&D discussion: symptoms of labor, discussed when to call, discussed what number to call, anesthetic/analgesic options reviewed and delivering clinician:  plans Physician. Postpartum supports and preparation: circumcision discussed and contraception plans discussed.  Follow up in 1 Week.

## 2014-01-26 ENCOUNTER — Encounter (HOSPITAL_COMMUNITY): Payer: Self-pay | Admitting: *Deleted

## 2014-01-26 ENCOUNTER — Inpatient Hospital Stay (HOSPITAL_COMMUNITY)
Admission: AD | Admit: 2014-01-26 | Discharge: 2014-01-26 | Disposition: A | Discharge status: Home / Self Care | Payer: Medicaid Other | Source: Ambulatory Visit | Attending: Obstetrics | Admitting: Obstetrics

## 2014-01-26 DIAGNOSIS — O479 False labor, unspecified: Secondary | ICD-10-CM | POA: Insufficient documentation

## 2014-01-26 MED ORDER — OXYCODONE-ACETAMINOPHEN 5-325 MG PO TABS
2.0000 | ORAL_TABLET | Freq: Once | ORAL | Status: AC
  Administered 2014-01-26: 2 via ORAL
  Filled 2014-01-26: qty 2

## 2014-01-26 NOTE — Discharge Instructions (Signed)
Braxton Hicks Contractions °Contractions of the uterus can occur throughout pregnancy. Contractions are not always a sign that you are in labor.  °WHAT ARE BRAXTON HICKS CONTRACTIONS?  °Contractions that occur before labor are called Braxton Hicks contractions, or false labor. Toward the end of pregnancy (32-34 weeks), these contractions can develop more often and may become more forceful. This is not true labor because these contractions do not result in opening (dilatation) and thinning of the cervix. They are sometimes difficult to tell apart from true labor because these contractions can be forceful and people have different pain tolerances. You should not feel embarrassed if you go to the hospital with false labor. Sometimes, the only way to tell if you are in true labor is for your health care provider to look for changes in the cervix. °If there are no prenatal problems or other health problems associated with the pregnancy, it is completely safe to be sent home with false labor and await the onset of true labor. °HOW CAN YOU TELL THE DIFFERENCE BETWEEN TRUE AND FALSE LABOR? °False Labor °· The contractions of false labor are usually shorter and not as hard as those of true labor.   °· The contractions are usually irregular.   °· The contractions are often felt in the front of the lower abdomen and in the groin.   °· The contractions may go away when you walk around or change positions while lying down.   °· The contractions get weaker and are shorter lasting as time goes on.   °· The contractions do not usually become progressively stronger, regular, and closer together as with true labor.   °True Labor °· Contractions in true labor last 30-70 seconds, become very regular, usually become more intense, and increase in frequency.   °· The contractions do not go away with walking.   °· The discomfort is usually felt in the top of the uterus and spreads to the lower abdomen and low back.   °· True labor can be  determined by your health care provider with an exam. This will show that the cervix is dilating and getting thinner.   °WHAT TO REMEMBER °· Keep up with your usual exercises and follow other instructions given by your health care provider.   °· Take medicines as directed by your health care provider.   °· Keep your regular prenatal appointments.   °· Eat and drink lightly if you think you are going into labor.   °· If Braxton Hicks contractions are making you uncomfortable:   °¨ Change your position from lying down or resting to walking, or from walking to resting.   °¨ Sit and rest in a tub of warm water.   °¨ Drink 2-3 glasses of water. Dehydration may cause these contractions.   °¨ Do slow and deep breathing several times an hour.   °WHEN SHOULD I SEEK IMMEDIATE MEDICAL CARE? °Seek immediate medical care if: °· Your contractions become stronger, more regular, and closer together.   °· You have fluid leaking or gushing from your vagina.   °· You have a fever.   °· You pass blood-tinged mucus.   °· You have vaginal bleeding.   °· You have continuous abdominal pain.   °· You have low back pain that you never had before.   °· You feel your baby's head pushing down and causing pelvic pressure.   °· Your baby is not moving as much as it used to.   °Document Released: 07/20/2005 Document Revised: 07/25/2013 Document Reviewed: 05/01/2013 °[employer]® Patient Information ©2015 [employer]. This information is not intended to replace advice given to you by your health care   provider. Make sure you discuss any questions you have with your health care provider. ° °Fetal Movement Counts °Patient Name: __________________________________________________ Patient Due Date: ____________________ °Performing a fetal movement count is highly recommended in high-risk pregnancies, but it is good for every pregnant woman to do. Your caregiver may ask you to start counting fetal movements at 28 weeks of the pregnancy. Fetal movements  often increase: °· After eating a full meal. °· After physical activity. °· After eating or drinking something sweet or cold. °· At rest. °Pay attention to when you feel the baby is most active. This will help you notice a pattern of your baby's sleep and wake cycles and what factors contribute to an increase in fetal movement. It is important to perform a fetal movement count at the same time each day when your baby is normally most active.  °HOW TO COUNT FETAL MOVEMENTS °1. Find a quiet and comfortable area to sit or lie down on your left side. Lying on your left side provides the best blood and oxygen circulation to your baby. °2. Write down the day and time on a sheet of paper or in a journal. °3. Start counting kicks, flutters, swishes, rolls, or jabs in a 2 hour period. You should feel at least 10 movements within 2 hours. °4. If you do not feel 10 movements in 2 hours, wait 2-3 hours and count again. Look for a change in the pattern or not enough counts in 2 hours. °SEEK MEDICAL CARE IF: °· You feel less than 10 counts in 2 hours, tried twice. °· There is no movement in over an hour. °· The pattern is changing or taking longer each day to reach 10 counts in 2 hours. °· You feel the baby is not moving as he or she usually does. °Date: ____________ Movements: ____________ Start time: ____________ Finish time: ____________  °Date: ____________ Movements: ____________ Start time: ____________ Finish time: ____________ °Date: ____________ Movements: ____________ Start time: ____________ Finish time: ____________ °Date: ____________ Movements: ____________ Start time: ____________ Finish time: ____________ °Date: ____________ Movements: ____________ Start time: ____________ Finish time: ____________ °Date: ____________ Movements: ____________ Start time: ____________ Finish time: ____________ °Date: ____________ Movements: ____________ Start time: ____________ Finish time: ____________ °Date: ____________  Movements: ____________ Start time: ____________ Finish time: ____________  °Date: ____________ Movements: ____________ Start time: ____________ Finish time: ____________ °Date: ____________ Movements: ____________ Start time: ____________ Finish time: ____________ °Date: ____________ Movements: ____________ Start time: ____________ Finish time: ____________ °Date: ____________ Movements: ____________ Start time: ____________ Finish time: ____________ °Date: ____________ Movements: ____________ Start time: ____________ Finish time: ____________ °Date: ____________ Movements: ____________ Start time: ____________ Finish time: ____________ °Date: ____________ Movements: ____________ Start time: ____________ Finish time: ____________  °Date: ____________ Movements: ____________ Start time: ____________ Finish time: ____________ °Date: ____________ Movements: ____________ Start time: ____________ Finish time: ____________ °Date: ____________ Movements: ____________ Start time: ____________ Finish time: ____________ °Date: ____________ Movements: ____________ Start time: ____________ Finish time: ____________ °Date: ____________ Movements: ____________ Start time: ____________ Finish time: ____________ °Date: ____________ Movements: ____________ Start time: ____________ Finish time: ____________ °Date: ____________ Movements: ____________ Start time: ____________ Finish time: ____________  °Date: ____________ Movements: ____________ Start time: ____________ Finish time: ____________ °Date: ____________ Movements: ____________ Start time: ____________ Finish time: ____________ °Date: ____________ Movements: ____________ Start time: ____________ Finish time: ____________ °Date: ____________ Movements: ____________ Start time: ____________ Finish time: ____________ °Date: ____________ Movements: ____________ Start time: ____________ Finish time: ____________ °Date: ____________ Movements: ____________ Start time:  ____________ Finish time: ____________ °Date: ____________ Movements: ____________   Start time: ____________ Finish time: ____________  °Date: ____________ Movements: ____________ Start time: ____________ Finish time: ____________ °Date: ____________ Movements: ____________ Start time: ____________ Finish time: ____________ °Date: ____________ Movements: ____________ Start time: ____________ Finish time: ____________ °Date: ____________ Movements: ____________ Start time: ____________ Finish time: ____________ °Date: ____________ Movements: ____________ Start time: ____________ Finish time: ____________ °Date: ____________ Movements: ____________ Start time: ____________ Finish time: ____________ °Date: ____________ Movements: ____________ Start time: ____________ Finish time: ____________  °Date: ____________ Movements: ____________ Start time: ____________ Finish time: ____________ °Date: ____________ Movements: ____________ Start time: ____________ Finish time: ____________ °Date: ____________ Movements: ____________ Start time: ____________ Finish time: ____________ °Date: ____________ Movements: ____________ Start time: ____________ Finish time: ____________ °Date: ____________ Movements: ____________ Start time: ____________ Finish time: ____________ °Date: ____________ Movements: ____________ Start time: ____________ Finish time: ____________ °Date: ____________ Movements: ____________ Start time: ____________ Finish time: ____________  °Date: ____________ Movements: ____________ Start time: ____________ Finish time: ____________ °Date: ____________ Movements: ____________ Start time: ____________ Finish time: ____________ °Date: ____________ Movements: ____________ Start time: ____________ Finish time: ____________ °Date: ____________ Movements: ____________ Start time: ____________ Finish time: ____________ °Date: ____________ Movements: ____________ Start time: ____________ Finish time: ____________ °Date:  ____________ Movements: ____________ Start time: ____________ Finish time: ____________ °Date: ____________ Movements: ____________ Start time: ____________ Finish time: ____________  °Date: ____________ Movements: ____________ Start time: ____________ Finish time: ____________ °Date: ____________ Movements: ____________ Start time: ____________ Finish time: ____________ °Date: ____________ Movements: ____________ Start time: ____________ Finish time: ____________ °Date: ____________ Movements: ____________ Start time: ____________ Finish time: ____________ °Date: ____________ Movements: ____________ Start time: ____________ Finish time: ____________ °Date: ____________ Movements: ____________ Start time: ____________ Finish time: ____________ °Document Released: 08/19/2006 Document Revised: 07/06/2012 Document Reviewed: 05/16/2012 °[employer]® Patient Information ©2015 [employer]. This information is not intended to replace advice given to you by your health care provider. Make sure you discuss any questions you have with your health care provider. ° °

## 2014-01-26 NOTE — MAU Note (Signed)
Contractions since 0430. Denies leaking fld or bleeding

## 2014-01-30 ENCOUNTER — Ambulatory Visit (INDEPENDENT_AMBULATORY_CARE_PROVIDER_SITE_OTHER): Payer: Medicaid Other | Admitting: Obstetrics

## 2014-01-30 ENCOUNTER — Encounter: Payer: Self-pay | Admitting: Obstetrics

## 2014-01-30 VITALS — BP 122/74 | HR 76 | Temp 98.1°F | Wt 145.0 lb

## 2014-01-30 DIAGNOSIS — Z3483 Encounter for supervision of other normal pregnancy, third trimester: Secondary | ICD-10-CM

## 2014-01-30 DIAGNOSIS — Z3480 Encounter for supervision of other normal pregnancy, unspecified trimester: Secondary | ICD-10-CM

## 2014-01-30 LAB — POCT URINALYSIS DIPSTICK
GLUCOSE UA: NEGATIVE
Ketones, UA: NEGATIVE
Nitrite, UA: NEGATIVE
RBC UA: NEGATIVE
SPEC GRAV UA: 1.02
pH, UA: 5.5

## 2014-01-30 NOTE — Progress Notes (Signed)
Subjective:    Cheyenne Hahn is a 23 y.o. female being seen today for her obstetrical visit. She is at 2014w6d gestation. Patient reports no complaints. Fetal movement: normal.  Problem List Items Addressed This Visit   None    Visit Diagnoses   Encounter for supervision of other normal pregnancy in third trimester    -  Primary    Relevant Orders       POCT urinalysis dipstick (Completed)      Patient Active Problem List   Diagnosis Date Noted  . Normal pregnancy 03/11/2011  . Decreased fetal movement 03/11/2011    Objective:    BP 122/74  Pulse 76  Temp(Src) 98.1 F (36.7 C)  Wt 145 lb (65.772 kg)  LMP 04/25/2013 FHT: 150 BPM  Uterine Size: size equals dates  Presentations: unsure  Pelvic Exam: Deferred    Assessment:    Pregnancy @ 6014w6d weeks   Plan:   Plans for delivery: Vaginal anticipated; labs reviewed; problem list updated Counseling: Consent signed. Infant feeding: plans to breastfeed. Cigarette smoking: quit date 2013. L&D discussion: symptoms of labor, discussed when to call, discussed what number to call, anesthetic/analgesic options reviewed and delivering clinician:  plans Physician. Postpartum supports and preparation: circumcision discussed and contraception plans discussed.  Follow up in 1 Week.

## 2014-02-06 ENCOUNTER — Ambulatory Visit (INDEPENDENT_AMBULATORY_CARE_PROVIDER_SITE_OTHER): Payer: Medicaid Other | Admitting: Obstetrics

## 2014-02-06 VITALS — BP 119/73 | HR 80 | Temp 98.3°F | Wt 143.0 lb

## 2014-02-06 DIAGNOSIS — Z3480 Encounter for supervision of other normal pregnancy, unspecified trimester: Secondary | ICD-10-CM

## 2014-02-06 DIAGNOSIS — Z3483 Encounter for supervision of other normal pregnancy, third trimester: Secondary | ICD-10-CM

## 2014-02-07 ENCOUNTER — Telehealth: Payer: Self-pay | Admitting: *Deleted

## 2014-02-07 ENCOUNTER — Encounter: Payer: Self-pay | Admitting: Obstetrics

## 2014-02-07 LAB — POCT URINALYSIS DIPSTICK
Bilirubin, UA: NEGATIVE
Blood, UA: NEGATIVE
GLUCOSE UA: NEGATIVE
Ketones, UA: NEGATIVE
LEUKOCYTES UA: NEGATIVE
Nitrite, UA: NEGATIVE
Spec Grav, UA: 1.015
UROBILINOGEN UA: NEGATIVE
pH, UA: 6

## 2014-02-07 NOTE — Telephone Encounter (Signed)
Patient is ready to schedule induction- she wants to look at this weekend- Fri,Sat, or Sun

## 2014-02-07 NOTE — Progress Notes (Signed)
Subjective:    Cheyenne Hahn is a 23 y.o. female being seen today for her obstetrical visit. She is at 30107w0d gestation. Patient reports no complaints. Fetal movement: normal.  Problem List Items Addressed This Visit   None    Visit Diagnoses   Encounter for supervision of other normal pregnancy in third trimester    -  Primary    Relevant Orders       POCT urinalysis dipstick      Patient Active Problem List   Diagnosis Date Noted  . Normal pregnancy 03/11/2011  . Decreased fetal movement 03/11/2011    Objective:    BP 119/73  Pulse 80  Temp(Src) 98.3 F (36.8 C)  Wt 143 lb (64.864 kg)  LMP 04/25/2013 FHT: 140 BPM  Uterine Size: size equals dates  Presentations: cephalic  Pelvic Exam: Deferred    Assessment:    Pregnancy @ 33107w0d weeks   Plan:   Plans for delivery: Vaginal anticipated; labs reviewed; problem list updated Counseling: Consent signed. Infant feeding: plans to breastfeed. Cigarette smoking: quit date 2013. L&D discussion: symptoms of labor, discussed when to call, discussed what number to call, anesthetic/analgesic options reviewed and delivering clinician:  plans Physician. Postpartum supports and preparation: circumcision discussed and contraception plans discussed.  Follow up in 1 Week.

## 2014-02-08 ENCOUNTER — Inpatient Hospital Stay (HOSPITAL_COMMUNITY)
Admission: AD | Admit: 2014-02-08 | Discharge: 2014-02-09 | DRG: 775 | Disposition: A | Discharge status: Home / Self Care | Payer: Medicaid Other | Source: Ambulatory Visit | Attending: Obstetrics | Admitting: Obstetrics

## 2014-02-08 ENCOUNTER — Encounter (HOSPITAL_COMMUNITY): Payer: Self-pay

## 2014-02-08 ENCOUNTER — Encounter (HOSPITAL_COMMUNITY): Payer: Medicaid Other | Admitting: Anesthesiology

## 2014-02-08 ENCOUNTER — Inpatient Hospital Stay (HOSPITAL_COMMUNITY): Payer: Medicaid Other | Admitting: Anesthesiology

## 2014-02-08 DIAGNOSIS — O139 Gestational [pregnancy-induced] hypertension without significant proteinuria, unspecified trimester: Secondary | ICD-10-CM | POA: Diagnosis present

## 2014-02-08 DIAGNOSIS — D649 Anemia, unspecified: Secondary | ICD-10-CM | POA: Diagnosis present

## 2014-02-08 DIAGNOSIS — Z833 Family history of diabetes mellitus: Secondary | ICD-10-CM

## 2014-02-08 DIAGNOSIS — Z87891 Personal history of nicotine dependence: Secondary | ICD-10-CM

## 2014-02-08 DIAGNOSIS — O479 False labor, unspecified: Secondary | ICD-10-CM | POA: Diagnosis present

## 2014-02-08 DIAGNOSIS — O9902 Anemia complicating childbirth: Secondary | ICD-10-CM | POA: Diagnosis present

## 2014-02-08 LAB — CBC
HCT: 30.9 % — ABNORMAL LOW (ref 36.0–46.0)
Hemoglobin: 10.1 g/dL — ABNORMAL LOW (ref 12.0–15.0)
MCH: 27.3 pg (ref 26.0–34.0)
MCHC: 32.7 g/dL (ref 30.0–36.0)
MCV: 83.5 fL (ref 78.0–100.0)
Platelets: 185 10*3/uL (ref 150–400)
RBC: 3.7 MIL/uL — ABNORMAL LOW (ref 3.87–5.11)
RDW: 14.2 % (ref 11.5–15.5)
WBC: 9.2 10*3/uL (ref 4.0–10.5)

## 2014-02-08 LAB — RPR

## 2014-02-08 MED ORDER — ACETAMINOPHEN 160 MG/5ML PO SOLN
1000.0000 mg | Freq: Once | ORAL | Status: AC
  Administered 2014-02-08: 1000 mg via ORAL
  Filled 2014-02-08: qty 40.6

## 2014-02-08 MED ORDER — TETANUS-DIPHTH-ACELL PERTUSSIS 5-2.5-18.5 LF-MCG/0.5 IM SUSP: 0.5000 mL | Freq: Once | INTRAMUSCULAR | Status: DC

## 2014-02-08 MED ORDER — MEDROXYPROGESTERONE ACETATE 150 MG/ML IM SUSP: 150.0000 mg | INTRAMUSCULAR | Status: DC | PRN

## 2014-02-08 MED ORDER — EPHEDRINE 5 MG/ML INJ
10.0000 mg | INTRAVENOUS | Status: DC | PRN
  Filled 2014-02-08: qty 2

## 2014-02-08 MED ORDER — OXYTOCIN 40 UNITS IN LACTATED RINGERS INFUSION - SIMPLE MED
62.5000 mL/h | INTRAVENOUS | Status: DC
  Administered 2014-02-08: 999 mL/h via INTRAVENOUS
  Filled 2014-02-08: qty 1000

## 2014-02-08 MED ORDER — ACETAMINOPHEN 325 MG PO TABS: 650.0000 mg | ORAL_TABLET | Freq: Four times a day (QID) | ORAL | Status: DC | PRN

## 2014-02-08 MED ORDER — PHENYLEPHRINE 40 MCG/ML (10ML) SYRINGE FOR IV PUSH (FOR BLOOD PRESSURE SUPPORT)
80.0000 ug | PREFILLED_SYRINGE | INTRAVENOUS | Status: DC | PRN
  Filled 2014-02-08: qty 10
  Filled 2014-02-08: qty 2

## 2014-02-08 MED ORDER — LANOLIN HYDROUS EX OINT: TOPICAL_OINTMENT | CUTANEOUS | Status: DC | PRN

## 2014-02-08 MED ORDER — SODIUM CHLORIDE 0.9 % IV SOLN
1.0000 g | Freq: Four times a day (QID) | INTRAVENOUS | Status: DC
  Administered 2014-02-08: 1 g via INTRAVENOUS
  Filled 2014-02-08 (×4): qty 1000

## 2014-02-08 MED ORDER — FENTANYL 2.5 MCG/ML BUPIVACAINE 1/10 % EPIDURAL INFUSION (WH - ANES)
14.0000 mL/h | INTRAMUSCULAR | Status: DC | PRN
  Administered 2014-02-08: 14 mL/h via EPIDURAL
  Filled 2014-02-08: qty 125

## 2014-02-08 MED ORDER — ZOLPIDEM TARTRATE 5 MG PO TABS: 5.0000 mg | ORAL_TABLET | Freq: Every evening | ORAL | Status: DC | PRN

## 2014-02-08 MED ORDER — SENNOSIDES-DOCUSATE SODIUM 8.6-50 MG PO TABS
2.0000 | ORAL_TABLET | ORAL | Status: DC
  Administered 2014-02-08: 2 via ORAL
  Filled 2014-02-08: qty 2

## 2014-02-08 MED ORDER — PRENATAL MULTIVITAMIN CH
1.0000 | ORAL_TABLET | Freq: Every day | ORAL | Status: DC
  Administered 2014-02-09: 1 via ORAL
  Filled 2014-02-08: qty 1

## 2014-02-08 MED ORDER — BENZOCAINE-MENTHOL 20-0.5 % EX AERO
1.0000 "application " | INHALATION_SPRAY | CUTANEOUS | Status: DC | PRN
  Filled 2014-02-08: qty 56

## 2014-02-08 MED ORDER — ONDANSETRON HCL 4 MG/2ML IJ SOLN: 4.0000 mg | Freq: Four times a day (QID) | INTRAMUSCULAR | Status: DC | PRN

## 2014-02-08 MED ORDER — DIBUCAINE 1 % RE OINT: 1.0000 "application " | TOPICAL_OINTMENT | RECTAL | Status: DC | PRN

## 2014-02-08 MED ORDER — CITRIC ACID-SODIUM CITRATE 334-500 MG/5ML PO SOLN: 30.0000 mL | ORAL | Status: DC | PRN

## 2014-02-08 MED ORDER — OXYTOCIN 40 UNITS IN LACTATED RINGERS INFUSION - SIMPLE MED: 62.5000 mL/h | INTRAVENOUS | Status: DC | PRN

## 2014-02-08 MED ORDER — SODIUM CHLORIDE 0.9 % IV SOLN
2.0000 g | Freq: Once | INTRAVENOUS | Status: AC
  Administered 2014-02-08: 2 g via INTRAVENOUS
  Filled 2014-02-08: qty 2000

## 2014-02-08 MED ORDER — LIDOCAINE HCL (PF) 1 % IJ SOLN
30.0000 mL | INTRAMUSCULAR | Status: DC | PRN
  Filled 2014-02-08: qty 30

## 2014-02-08 MED ORDER — ONDANSETRON HCL 4 MG PO TABS: 4.0000 mg | ORAL_TABLET | ORAL | Status: DC | PRN

## 2014-02-08 MED ORDER — SIMETHICONE 80 MG PO CHEW: 80.0000 mg | CHEWABLE_TABLET | ORAL | Status: DC | PRN

## 2014-02-08 MED ORDER — BUTORPHANOL TARTRATE 1 MG/ML IJ SOLN
2.0000 mg | INTRAMUSCULAR | Status: DC | PRN
  Administered 2014-02-08: 2 mg via INTRAVENOUS
  Filled 2014-02-08: qty 2

## 2014-02-08 MED ORDER — IBUPROFEN 600 MG PO TABS
600.0000 mg | ORAL_TABLET | Freq: Four times a day (QID) | ORAL | Status: DC | PRN
  Administered 2014-02-08: 600 mg via ORAL
  Filled 2014-02-08: qty 1

## 2014-02-08 MED ORDER — PHENYLEPHRINE 40 MCG/ML (10ML) SYRINGE FOR IV PUSH (FOR BLOOD PRESSURE SUPPORT)
80.0000 ug | PREFILLED_SYRINGE | INTRAVENOUS | Status: DC | PRN
  Filled 2014-02-08: qty 2

## 2014-02-08 MED ORDER — LACTATED RINGERS IV SOLN
INTRAVENOUS | Status: DC
  Administered 2014-02-08: 03:00:00 via INTRAVENOUS

## 2014-02-08 MED ORDER — WITCH HAZEL-GLYCERIN EX PADS: 1.0000 "application " | MEDICATED_PAD | CUTANEOUS | Status: DC | PRN

## 2014-02-08 MED ORDER — OXYTOCIN BOLUS FROM INFUSION: 500.0000 mL | INTRAVENOUS | Status: DC

## 2014-02-08 MED ORDER — LACTATED RINGERS IV SOLN
500.0000 mL | Freq: Once | INTRAVENOUS | Status: AC
  Administered 2014-02-08: 500 mL via INTRAVENOUS

## 2014-02-08 MED ORDER — IBUPROFEN 600 MG PO TABS
600.0000 mg | ORAL_TABLET | Freq: Four times a day (QID) | ORAL | Status: DC
  Administered 2014-02-08 – 2014-02-09 (×4): 600 mg via ORAL
  Filled 2014-02-08 (×4): qty 1

## 2014-02-08 MED ORDER — FLEET ENEMA 7-19 GM/118ML RE ENEM: 1.0000 | ENEMA | RECTAL | Status: DC | PRN

## 2014-02-08 MED ORDER — ACETAMINOPHEN 325 MG PO TABS: 650.0000 mg | ORAL_TABLET | ORAL | Status: DC | PRN

## 2014-02-08 MED ORDER — ONDANSETRON HCL 4 MG/2ML IJ SOLN: 4.0000 mg | INTRAMUSCULAR | Status: DC | PRN

## 2014-02-08 MED ORDER — FENTANYL 2.5 MCG/ML BUPIVACAINE 1/10 % EPIDURAL INFUSION (WH - ANES)
INTRAMUSCULAR | Status: DC | PRN
Start: 2014-02-08 — End: 2014-02-08
  Administered 2014-02-08: 13 mL/h via EPIDURAL

## 2014-02-08 MED ORDER — ACETAMINOPHEN 10 MG/ML IV SOLN: 1000.0000 mg | Freq: Once | INTRAVENOUS | Status: DC

## 2014-02-08 MED ORDER — OXYCODONE-ACETAMINOPHEN 5-325 MG PO TABS
1.0000 | ORAL_TABLET | ORAL | Status: DC | PRN
  Administered 2014-02-09: 1 via ORAL
  Filled 2014-02-08: qty 1

## 2014-02-08 MED ORDER — DIPHENHYDRAMINE HCL 50 MG/ML IJ SOLN: 12.5000 mg | INTRAMUSCULAR | Status: DC | PRN

## 2014-02-08 MED ORDER — DIPHENHYDRAMINE HCL 25 MG PO CAPS: 25.0000 mg | ORAL_CAPSULE | Freq: Four times a day (QID) | ORAL | Status: DC | PRN

## 2014-02-08 MED ORDER — OXYCODONE-ACETAMINOPHEN 5-325 MG PO TABS: 1.0000 | ORAL_TABLET | ORAL | Status: DC | PRN

## 2014-02-08 MED ORDER — LIDOCAINE HCL (PF) 1 % IJ SOLN
INTRAMUSCULAR | Status: DC | PRN
  Administered 2014-02-08 (×2): 4 mL

## 2014-02-08 MED ORDER — LACTATED RINGERS IV SOLN
500.0000 mL | INTRAVENOUS | Status: DC | PRN
Start: 2014-02-08 — End: 2014-02-08

## 2014-02-08 NOTE — Anesthesia Procedure Notes (Signed)
Epidural Patient location during procedure: OB Start time: 02/08/2014 7:42 AM  Staffing Anesthesiologist: Aleen Marston A. Performed by: anesthesiologist   Preanesthetic Checklist Completed: patient identified, site marked, surgical consent, pre-op evaluation, timeout performed, IV checked, risks and benefits discussed and monitors and equipment checked  Epidural Patient position: sitting Prep: site prepped and draped and DuraPrep Patient monitoring: continuous pulse ox and blood pressure Approach: midline Location: L4-L5 Injection technique: LOR air  Needle:  Needle type: Tuohy  Needle gauge: 17 G Needle length: 9 cm and 9 Needle insertion depth: 4 cm Catheter type: closed end flexible Catheter size: 19 Gauge Catheter at skin depth: 9 cm Test dose: negative and Other  Assessment Events: blood not aspirated, injection not painful, no injection resistance, negative IV test and no paresthesia  Additional Notes Patient identified. Risks and benefits discussed including failed block, incomplete  Pain control, post dural puncture headache, nerve damage, paralysis, blood pressure Changes, nausea, vomiting, reactions to medications-both toxic and allergic and post Partum back pain. All questions were answered. Patient expressed understanding and wished to proceed. Sterile technique was used throughout procedure. Epidural site was Dressed with sterile barrier dressing. No paresthesias, signs of intravascular injection Or signs of intrathecal spread were encountered.  Patient was more comfortable after the epidural was dosed. Please see RN's note for documentation of vital signs and FHR which are stable.

## 2014-02-08 NOTE — Anesthesia Preprocedure Evaluation (Signed)
Anesthesia Evaluation  Patient identified by MRN, date of birth, ID band Patient awake    Reviewed: Allergy & Precautions, H&P , Patient's Chart, lab work & pertinent test results  Airway Mallampati: II TM Distance: >3 FB Neck ROM: Full    Dental no notable dental hx. (+) Teeth Intact   Pulmonary former smoker,  breath sounds clear to auscultation  Pulmonary exam normal       Cardiovascular hypertension, Rhythm:Regular Rate:Normal  Hx/o PIH   Neuro/Psych negative neurological ROS  negative psych ROS   GI/Hepatic negative GI ROS, Neg liver ROS,   Endo/Other  negative endocrine ROS  Renal/GU negative Renal ROS  negative genitourinary   Musculoskeletal negative musculoskeletal ROS (+)   Abdominal   Peds  Hematology  (+) anemia ,   Anesthesia Other Findings   Reproductive/Obstetrics (+) Pregnancy                           Anesthesia Physical Anesthesia Plan  ASA: II  Anesthesia Plan: Epidural   Post-op Pain Management:    Induction:   Airway Management Planned: Natural Airway  Additional Equipment:   Intra-op Plan:   Post-operative Plan:   Informed Consent: I have reviewed the patients History and Physical, chart, labs and discussed the procedure including the risks, benefits and alternatives for the proposed anesthesia with the patient or authorized representative who has indicated his/her understanding and acceptance.     Plan Discussed with: Anesthesiologist  Anesthesia Plan Comments:         Anesthesia Quick Evaluation

## 2014-02-08 NOTE — H&P (Signed)
Cheyenne Hahn is a 23 y.o. female presenting for UC's.. Maternal Medical History:  Reason for admission: Contractions.   Fetal activity: Perceived fetal activity is normal.   Last perceived fetal movement was within the past hour.    Prenatal complications: no prenatal complications   OB History   Grav Para Term Preterm Abortions TAB SAB Ect Mult Living   2 1 1  0 0 0 0 0 0 1     Past Medical History  Diagnosis Date  . No pertinent past medical history   . Pregnancy induced hypertension   . Anemia    Past Surgical History  Procedure Laterality Date  . Wisdom tooth extraction    . No past surgeries     Family History: family history includes Diabetes in her maternal grandmother; Hypertension in her maternal aunt, maternal grandmother, maternal uncle, and mother. Social History:  reports that she quit smoking about 21 months ago. She has never used smokeless tobacco. She reports that she does not drink alcohol or use illicit drugs.   Prenatal Transfer Tool  Maternal Diabetes: No Genetic Screening: Normal Maternal Ultrasounds/Referrals: Normal Fetal Ultrasounds or other Referrals:  None Maternal Substance Abuse:  No Significant Maternal Medications:  None Significant Maternal Lab Results:  None Other Comments:  None  Review of Systems  All other systems reviewed and are negative.   Dilation: 5 Effacement (%): 80 Station: -2 Exam by:: Elana AlmElizabeth [facility] RNC Blood pressure 117/74, pulse 102, temperature 97.6 F (36.4 C), temperature source Oral, resp. rate 20, height 5\' 2"  (1.575 m), weight 151 lb (68.493 kg), last menstrual period 04/25/2013, SpO2 100.00%. Maternal Exam:  Uterine Assessment: Contraction strength is moderate.  Abdomen: Patient reports no abdominal tenderness. Fetal presentation: vertex  Introitus: Normal vulva. Normal vagina.  Pelvis: adequate for delivery.   Cervix: Cervix evaluated by digital exam.     Physical Exam  Nursing note and vitals  reviewed. Constitutional: She is oriented to person, place, and time. She appears well-developed and well-nourished.  HENT:  Head: Normocephalic and atraumatic.  Eyes: Pupils are equal, round, and reactive to light.  Neck: Normal range of motion. Neck supple.  Cardiovascular: Normal rate and regular rhythm.   Respiratory: Effort normal and breath sounds normal.  GI: Soft.  Genitourinary: Vagina normal and uterus normal.  Musculoskeletal: Normal range of motion.  Neurological: She is alert and oriented to person, place, and time.  Skin: Skin is warm and dry.  Psychiatric: She has a normal mood and affect. Her behavior is normal. Judgment and thought content normal.    Prenatal labs: ABO, Rh: A/POS/-- (03/10 1036) Antibody: NEG (03/10 1036) Rubella: 4.20 (03/10 1036) RPR: NON REAC (03/10 1036)  HBsAg: NEGATIVE (03/10 1036)  HIV: NON REACTIVE (03/10 1036)  GBS: Detected (06/08 1148)   Assessment/Plan: 39 weeks.  Active labor.  Admit.   Reggie Bise A 02/08/2014, 9:03 AM

## 2014-02-09 LAB — CBC
HCT: 29.2 % — ABNORMAL LOW (ref 36.0–46.0)
HEMOGLOBIN: 9.1 g/dL — AB (ref 12.0–15.0)
MCH: 26.2 pg (ref 26.0–34.0)
MCHC: 31.2 g/dL (ref 30.0–36.0)
MCV: 84.1 fL (ref 78.0–100.0)
PLATELETS: 185 10*3/uL (ref 150–400)
RBC: 3.47 MIL/uL — AB (ref 3.87–5.11)
RDW: 14.4 % (ref 11.5–15.5)
WBC: 12.8 10*3/uL — ABNORMAL HIGH (ref 4.0–10.5)

## 2014-02-09 MED ORDER — IBUPROFEN 600 MG PO TABS: 600.0000 mg | ORAL_TABLET | Freq: Four times a day (QID) | ORAL | Status: DC

## 2014-02-09 NOTE — Anesthesia Postprocedure Evaluation (Signed)
Anesthesia Post Note  Patient: Cheyenne Hahn  Procedure(s) Performed: * No procedures listed *  Anesthesia type: Epidural  Patient location: Mother/Baby  Post pain: Pain level controlled  Post assessment: Post-op Vital signs reviewed  Last Vitals:  Filed Vitals:   02/09/14 0605  BP: 113/51  Pulse: 104  Temp: 36.7 C  Resp: 16    Post vital signs: Reviewed  Level of consciousness:alert  Complications: No apparent anesthesia complications

## 2014-02-09 NOTE — Discharge Summary (Signed)
Obstetric Discharge Summary Reason for Admission: onset of labor Prenatal Procedures: none Intrapartum Procedures: spontaneous vaginal delivery Postpartum Procedures: none Complications-Operative and Postpartum: none Hemoglobin  Date Value Ref Range Status  02/09/2014 9.1* 12.0 - 15.0 g/dL Final     HCT  Date Value Ref Range Status  02/09/2014 29.2* 36.0 - 46.0 % Final    Physical Exam:  General: alert and cooperative Lochia: appropriate Uterine Fundus: firm Incision: NA DVT Evaluation: No evidence of DVT seen on physical exam. Negative Homan's sign.  Discharge Diagnoses: Term Pregnancy-delivered  Discharge Information: Date: 02/09/2014 Activity: pelvic rest Diet: routine Medications: PNV and Ibuprofen Condition: stable Instructions: refer to practice specific booklet Discharge to: home   Newborn Data: Live born female  Birth Weight: 6 lb 15.6 oz (3165 g) APGAR: 9, 9  Home with mother.  Cheyenne Hahn 02/09/2014, 9:08 AM

## 2014-02-12 ENCOUNTER — Encounter: Payer: Medicaid Other | Admitting: Obstetrics

## 2014-06-04 ENCOUNTER — Encounter (HOSPITAL_COMMUNITY): Payer: Self-pay

## 2014-06-19 ENCOUNTER — Telehealth: Payer: Self-pay | Admitting: *Deleted

## 2014-06-19 NOTE — Telephone Encounter (Signed)
Call from Dr Donnie Coffinubin- patient scored 12 on depression screening. Ask if we could follow up. 12:59 Call to patient - discussed screening result- patient states she is under a lot of stress being by herself. She has returned to work. Offered help in any way she needs- patient declines at this time. Patient did not follow up postpartum and did make an appointment to discuss birth control. Patient is due an annual end of 09/2014.

## 2014-07-04 ENCOUNTER — Ambulatory Visit: Payer: Medicaid Other | Admitting: Obstetrics

## 2014-07-06 ENCOUNTER — Ambulatory Visit: Payer: Medicaid Other | Admitting: Obstetrics

## 2015-05-18 ENCOUNTER — Emergency Department (HOSPITAL_COMMUNITY)
Admission: EM | Admit: 2015-05-18 | Discharge: 2015-05-18 | Disposition: A | Discharge status: Home / Self Care | Payer: Medicaid Other | Attending: Emergency Medicine | Admitting: Emergency Medicine

## 2015-05-18 ENCOUNTER — Encounter (HOSPITAL_COMMUNITY): Payer: Self-pay

## 2015-05-18 DIAGNOSIS — Z862 Personal history of diseases of the blood and blood-forming organs and certain disorders involving the immune mechanism: Secondary | ICD-10-CM | POA: Insufficient documentation

## 2015-05-18 DIAGNOSIS — K0889 Other specified disorders of teeth and supporting structures: Secondary | ICD-10-CM | POA: Insufficient documentation

## 2015-05-18 DIAGNOSIS — Z87891 Personal history of nicotine dependence: Secondary | ICD-10-CM | POA: Diagnosis not present

## 2015-05-18 MED ORDER — PENICILLIN V POTASSIUM 500 MG PO TABS: 500.0000 mg | ORAL_TABLET | Freq: Four times a day (QID) | ORAL | Status: AC

## 2015-05-18 MED ORDER — BUPIVACAINE-EPINEPHRINE (PF) 0.5% -1:200000 IJ SOLN
1.8000 mL | Freq: Once | INTRAMUSCULAR | Status: AC
  Administered 2015-05-18: 1.8 mL
  Filled 2015-05-18: qty 1.8

## 2015-05-18 NOTE — ED Provider Notes (Signed)
CSN: [id number]     Arrival date & time 05/18/15  1221 History   First MD Initiated Contact with Patient 05/18/15 1251     Chief Complaint  Patient presents with  . Dental Pain     (Consider location/radiation/quality/duration/timing/severity/associated sxs/prior Treatment) HPI Cheyenne Hahn is a 24 y.o. female because of her evaluation of dental pain. Patient reports she has had right-sided upper molar pain for the past 3 days. Pain is constant, aching and dull in nature. Worse with chewing. Currently rated as a 9/10. Not relieved with OTC medications including BC powders, Orajel. Reports her previous dentist was [facility], but she has not seen them in 2 years. No fevers, chills, sore throat, difficulties swallowing, talking or breathing.  Past Medical History  Diagnosis Date  . No pertinent past medical history   . Pregnancy induced hypertension   . Anemia    Past Surgical History  Procedure Laterality Date  . Wisdom tooth extraction    . No past surgeries     Family History  Problem Relation Age of Onset  . Hypertension Mother   . Hypertension Maternal Aunt   . Hypertension Maternal Uncle   . Hypertension Maternal Grandmother   . Diabetes Maternal Grandmother    Social History  Substance Use Topics  . Smoking status: Former Smoker    Quit date: 05/02/2012  . Smokeless tobacco: Never Used     Comment: 2011  . Alcohol Use: No   OB History    Gravida Para Term Preterm AB TAB SAB Ectopic Multiple Living   2 2 2  0 0 0 0 0 0 2     Review of Systems A 10 point review of systems was completed and was negative except for pertinent positives and negatives as mentioned in the history of present illness     Allergies  Review of patient's allergies indicates no known allergies.  Home Medications   Prior to Admission medications   Medication Sig Start Date End Date Taking? Authorizing Provider  ibuprofen (ADVIL,MOTRIN) 600 MG tablet Take 1 tablet (600 mg total) by  mouth every 6 (six) hours. 02/09/14   Amy Tillie RungH Wren, CNM  penicillin v potassium (VEETID) 500 MG tablet Take 1 tablet (500 mg total) by mouth 4 (four) times daily. 05/18/15 05/25/15  Joycie PeekBenjamin Yarexi Pawlicki, PA-C  Prenatal Vit-Fe Fumarate-FA (PRENATAL MULTIVITAMIN) TABS tablet Take 1 tablet by mouth daily at 12 noon.    Historical Provider, MD   BP 126/78 mmHg  Pulse 80  Temp(Src) 98.4 F (36.9 C) (Oral)  Resp 13  SpO2 100%  LMP 04/27/2015 (Exact Date) Physical Exam  Constitutional:  Awake, alert, nontoxic appearance.  HENT:  Head: Atraumatic.  Discomfort located to right maxillary molars, tooth #1. Overall appropriate dentition. Mucous membranes are moist. No unilateral tonsillar swelling, uvula midline, no glossal swelling or elevation. No trismus. No fluctuance or evidence of a drainable abscess. No other evidence of emergent infection, Retropharyngeal or Peritonsillar abscess, Ludwig or Vincents angina. Tolerating secretions well. Patent airway   Eyes: Right eye exhibits no discharge. Left eye exhibits no discharge.  Neck: Neck supple.  Pulmonary/Chest: Effort normal. She exhibits no tenderness.  Abdominal: Soft. There is no tenderness. There is no rebound.  Musculoskeletal: She exhibits no tenderness.  Baseline ROM, no obvious new focal weakness.  Neurological:  Mental status and motor strength appears baseline for patient and situation.  Skin: No rash noted.  Psychiatric: She has a normal mood and affect.  Nursing note and vitals reviewed.  ED Course  Procedures (including critical care time) NERVE BLOCK Performed by: Sharlene Motts Consent: Verbal consent obtained. Required items: required blood products, implants, devices, and special equipment available Time out: Immediately prior to procedure a "time out" was called to verify the correct patient, procedure, equipment, support staff and site/side marked as required.  Indication: Dental pain  Nerve block body site: Right  posterior superior alveolar   Preparation: Patient was prepped and draped in the usual sterile fashion. Needle gauge: 24 G Location technique: anatomical landmarks  Local anesthetic: Bupivacaine   Anesthetic total: 1.8 ml  Outcome: pain improved Patient tolerance: Patient tolerated the procedure well with no immediate complications.  Labs Review Labs Reviewed - No data to display  Imaging Review No results found. I have personally reviewed and evaluated these images and lab results as part of my medical decision-making.   EKG Interpretation None     Meds given in ED:  Medications  bupivacaine-epinephrine (MARCAINE W/ EPI) 0.5% -1:200000 injection 1.8 mL (not administered)    New Prescriptions   PENICILLIN V POTASSIUM (VEETID) 500 MG TABLET    Take 1 tablet (500 mg total) by mouth 4 (four) times daily.   Filed Vitals:   05/18/15 1226  BP: 126/78  Pulse: 80  Temp: 98.4 F (36.9 C)  TempSrc: Oral  Resp: 13  SpO2: 100%    MDM  Patient here for evaluation of right maxillary molar toothache. No evidence of abscess. No evidence of retropharyngeal, peritonsillar abscess or Ludwig angina. Patient has overall appropriate dentition. We'll provide outpatient dental resources. Expresses relief after oral nerve block in the ED. Overall, patient appears well, nontoxic with normal vital signs and is appropriate for discharge. DC with antibiotics. Encouraged continued use of Motrin at home for discomfort.  Final diagnoses:  Pain, dental        Joycie Peek, PA-C 05/18/15 1344  Lavera Guise, MD 05/18/15 [phone]

## 2015-05-18 NOTE — Progress Notes (Addendum)
Pt c/o right sided dental pain times three days. No facial swelling noted. Pt stated,"I want that free dentist to pull my tooth.I was told to come here to get a referral."2p-Pt requested to see the PA. She stated she did not get any relief from the injection. PA made aware.

## 2015-05-18 NOTE — Discharge Instructions (Signed)
It is important for you to follow-up with Dr. Russella DarBenitez with dentistry for definitive care of your toothache. Take your antibiotics as directed. Return to ED for worsening symptoms.

## 2015-05-18 NOTE — ED Notes (Signed)
She c/o right rear upper toothache x 3 days.  She is in no distress and tells us she is otherwise healthy.

## 2015-05-26 ENCOUNTER — Encounter (HOSPITAL_COMMUNITY): Payer: Self-pay | Admitting: *Deleted

## 2015-05-26 ENCOUNTER — Emergency Department (HOSPITAL_COMMUNITY)
Admission: EM | Admit: 2015-05-26 | Discharge: 2015-05-26 | Disposition: A | Discharge status: Home / Self Care | Payer: Medicaid Other | Attending: Emergency Medicine | Admitting: Emergency Medicine

## 2015-05-26 DIAGNOSIS — Z87891 Personal history of nicotine dependence: Secondary | ICD-10-CM | POA: Diagnosis not present

## 2015-05-26 DIAGNOSIS — Y752 Prosthetic and other implants, materials and neurological devices associated with adverse incidents: Secondary | ICD-10-CM | POA: Insufficient documentation

## 2015-05-26 DIAGNOSIS — Z862 Personal history of diseases of the blood and blood-forming organs and certain disorders involving the immune mechanism: Secondary | ICD-10-CM | POA: Insufficient documentation

## 2015-05-26 DIAGNOSIS — Z791 Long term (current) use of non-steroidal anti-inflammatories (NSAID): Secondary | ICD-10-CM | POA: Diagnosis not present

## 2015-05-26 DIAGNOSIS — T85898D Other specified complication of other internal prosthetic devices, implants and grafts, subsequent encounter: Secondary | ICD-10-CM | POA: Diagnosis not present

## 2015-05-26 DIAGNOSIS — Z79899 Other long term (current) drug therapy: Secondary | ICD-10-CM | POA: Insufficient documentation

## 2015-05-26 DIAGNOSIS — T85848D Pain due to other internal prosthetic devices, implants and grafts, subsequent encounter: Secondary | ICD-10-CM

## 2015-05-26 DIAGNOSIS — K029 Dental caries, unspecified: Secondary | ICD-10-CM | POA: Diagnosis not present

## 2015-05-26 DIAGNOSIS — R1110 Vomiting, unspecified: Secondary | ICD-10-CM | POA: Diagnosis not present

## 2015-05-26 MED ORDER — ONDANSETRON 4 MG PO TBDP
4.0000 mg | ORAL_TABLET | Freq: Once | ORAL | Status: AC
  Administered 2015-05-26: 4 mg via ORAL
  Filled 2015-05-26: qty 1

## 2015-05-26 MED ORDER — AMOXICILLIN 500 MG PO CAPS: 500.0000 mg | ORAL_CAPSULE | Freq: Three times a day (TID) | ORAL | Status: DC

## 2015-05-26 MED ORDER — HYDROCODONE-ACETAMINOPHEN 5-325 MG PO TABS: 2.0000 | ORAL_TABLET | ORAL | Status: DC | PRN

## 2015-05-26 NOTE — ED Provider Notes (Signed)
CSN: [id number]     Arrival date & time 05/26/15  1204 History  By signing my name below, I, Soijett Blue, attest that this documentation has been prepared under the direction and in the presence of Langston MaskerKaren Sofia, PA-C Electronically Signed: Soijett Blue, ED Scribe. 05/26/2015. 1:04 PM.   Chief Complaint  Patient presents with  . Dental Pain      The history is provided by the patient. No language interpreter was used.    Cheyenne Hahn is a 24 y.o. female who presents to the Emergency Department complaining of moderate right lower back dental pain onset 1 week. She was seen for her symptoms on 05/18/15 in the ED and had a nerve block and Rx abx. She notes that she didn't start her abx because she went to the dentist and they didn't say anything about her having abx at the time. She states that when she went to the dentist she didn't have the tooth pulled due to her having to pay for her xrays. She states that she is having associated symptoms of vomiting. She states that she has tried OTC tylenol, ibuprofen, and someone else's vicodin with no relief for her symptoms. She denies gum swelling/bleeding, sore throat, fever, chills, and any other symptoms.    Past Medical History  Diagnosis Date  . No pertinent past medical history   . Pregnancy induced hypertension   . Anemia    Past Surgical History  Procedure Laterality Date  . Wisdom tooth extraction    . No past surgeries     Family History  Problem Relation Age of Onset  . Hypertension Mother   . Hypertension Maternal Aunt   . Hypertension Maternal Uncle   . Hypertension Maternal Grandmother   . Diabetes Maternal Grandmother    Social History  Substance Use Topics  . Smoking status: Former Smoker    Quit date: 05/02/2012  . Smokeless tobacco: Never Used     Comment: 2011  . Alcohol Use: No   OB History    Gravida Para Term Preterm AB TAB SAB Ectopic Multiple Living   2 2 2  0 0 0 0 0 0 2     Review of Systems   Constitutional: Negative for fever and chills.  HENT: Positive for dental problem and facial swelling. Negative for drooling, ear pain, rhinorrhea, sore throat and trouble swallowing.   Gastrointestinal: Positive for vomiting. Negative for nausea.      Allergies  Review of patient's allergies indicates no known allergies.  Home Medications   Prior to Admission medications   Medication Sig Start Date End Date Taking? Authorizing Provider  ibuprofen (ADVIL,MOTRIN) 600 MG tablet Take 1 tablet (600 mg total) by mouth every 6 (six) hours. 02/09/14   Amy Tillie RungH Wren, CNM  Prenatal Vit-Fe Fumarate-FA (PRENATAL MULTIVITAMIN) TABS tablet Take 1 tablet by mouth daily at 12 noon.    Historical Provider, MD   BP 161/86 mmHg  Pulse 64  Temp(Src) 98.4 F (36.9 C) (Oral)  Resp 18  SpO2 100%  LMP 05/20/2015 Physical Exam  Constitutional: She is oriented to person, place, and time. She appears well-developed and well-nourished. No distress.  HENT:  Head: Normocephalic and atraumatic.  Severely decayed left third molar.   Eyes: EOM are normal.  Neck: Neck supple.  Cardiovascular: Normal rate.   Pulmonary/Chest: Effort normal. No respiratory distress.  Abdominal: Soft. There is no tenderness.  Musculoskeletal: Normal range of motion.  Neurological: She is alert and oriented to person, place,  and time.  Skin: Skin is warm and dry.  Psychiatric: She has a normal mood and affect. Her behavior is normal.  Nursing note and vitals reviewed.   ED Course  Procedures (including critical care time) DIAGNOSTIC STUDIES: Oxygen Saturation is 100% on RA, nl by my interpretation.    COORDINATION OF CARE: 1:00 PM Discussed treatment plan with pt at bedside which includes pain medication Rx and zofran and pt agreed to plan.    Labs Review Labs Reviewed - No data to display  Imaging Review No results found.    EKG Interpretation None      MDM   Final diagnoses:  Dental implant pain,  subsequent encounter    amoxicillian  Hydrocodone See your Dentist tomorrow for extraction     Elson Areas, PA-C 05/26/15 1313  Gwyneth Sprout, MD 05/26/15 [phone]

## 2015-05-26 NOTE — Discharge Instructions (Signed)

## 2015-05-26 NOTE — ED Notes (Signed)
Patient presents with right lower back molar pain and some jaw swelling.  Patient states pain has been there >1 week and she was evaluated here for it on 10/15.  Patient did not get Rx for abx filled.  Patient has an appt with dentist tomorrow.  Patient has been using ES Tylenol, ibuprofen and someone else's Vicodin for pain with little relief.  Patient endorses N/V, but denies fever.

## 2015-06-23 IMAGING — CR DG CHEST 2V
2 series · 2 of 2 positions shown · non-contrast
Comparison: 09/19/2010

CLINICAL DATA: Cough, upper respiratory infection

CHEST - 2 VIEW

[w chest pa]
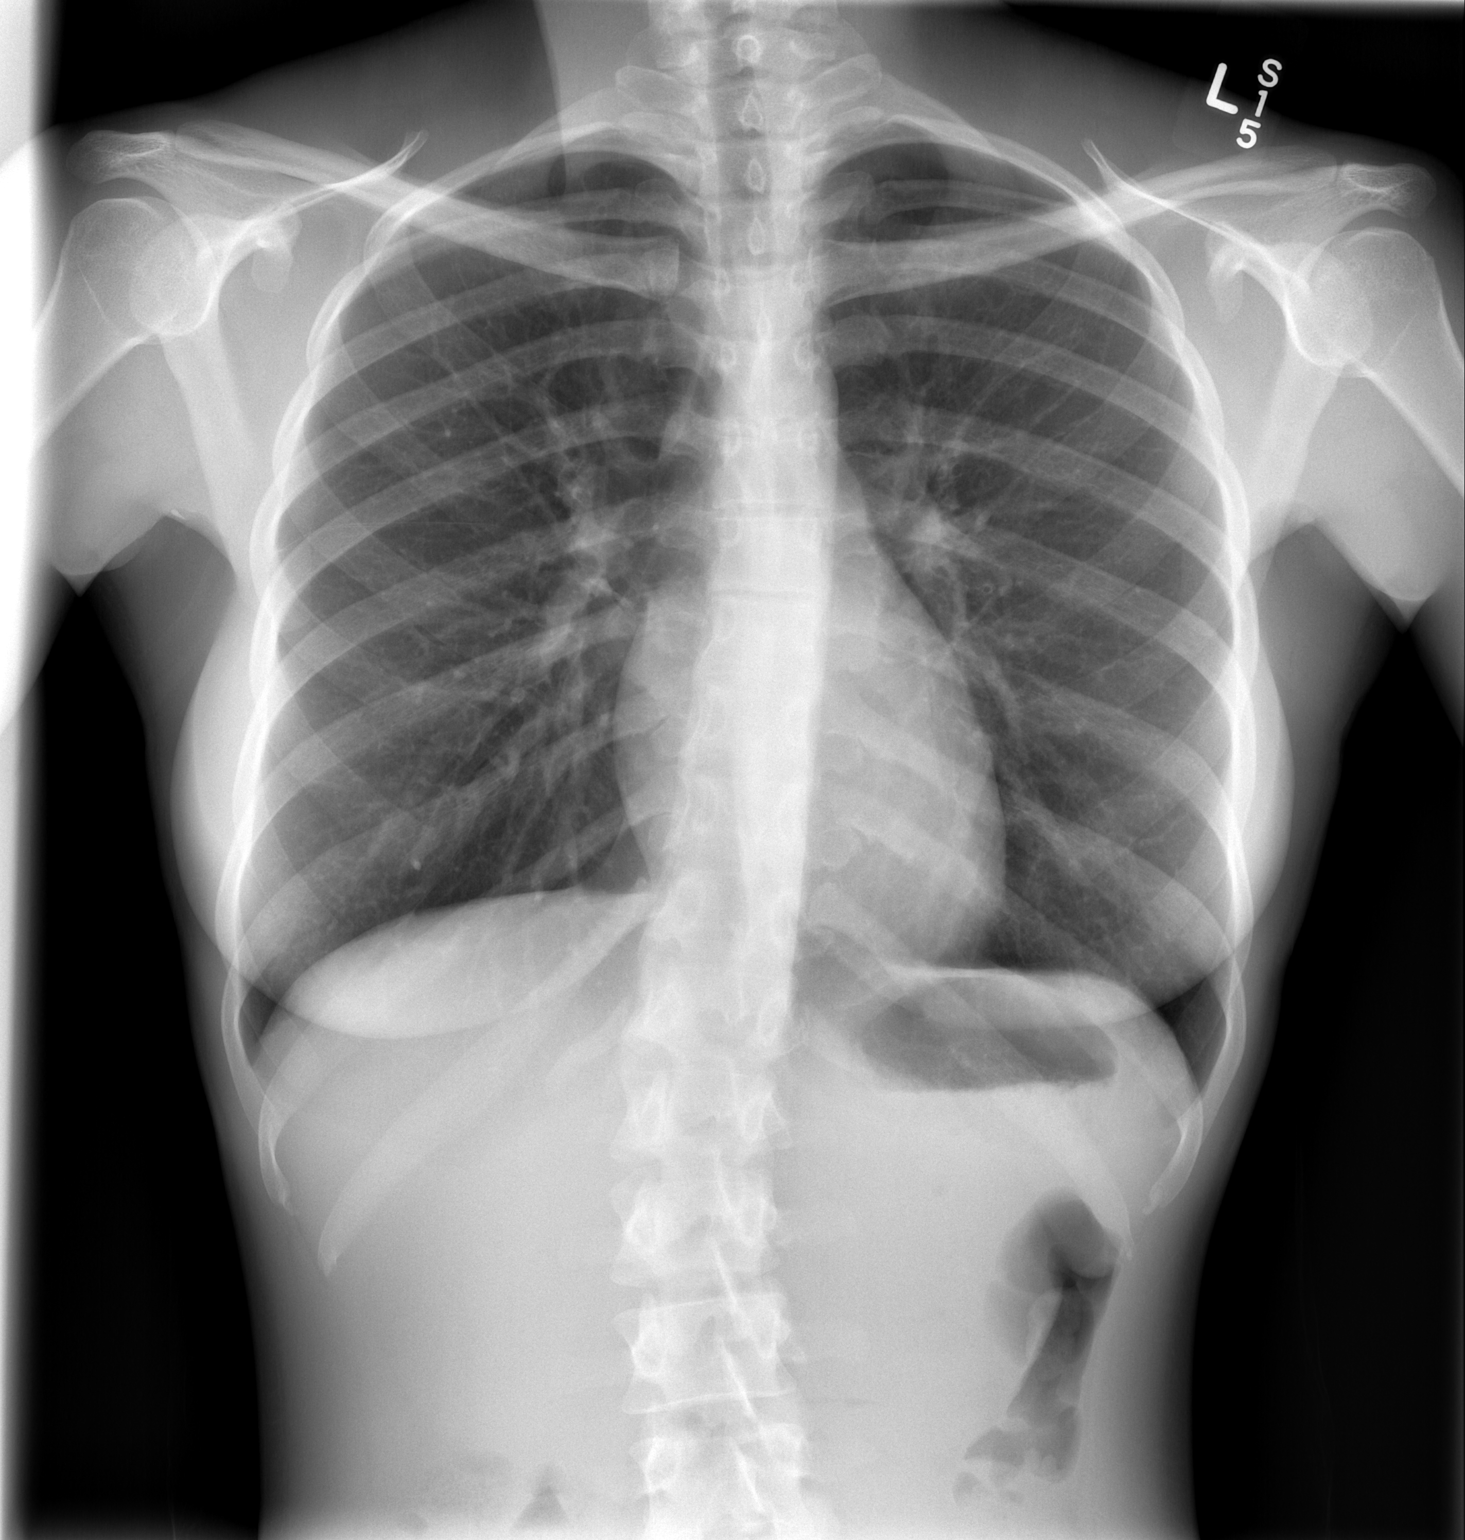

[w chest lat]
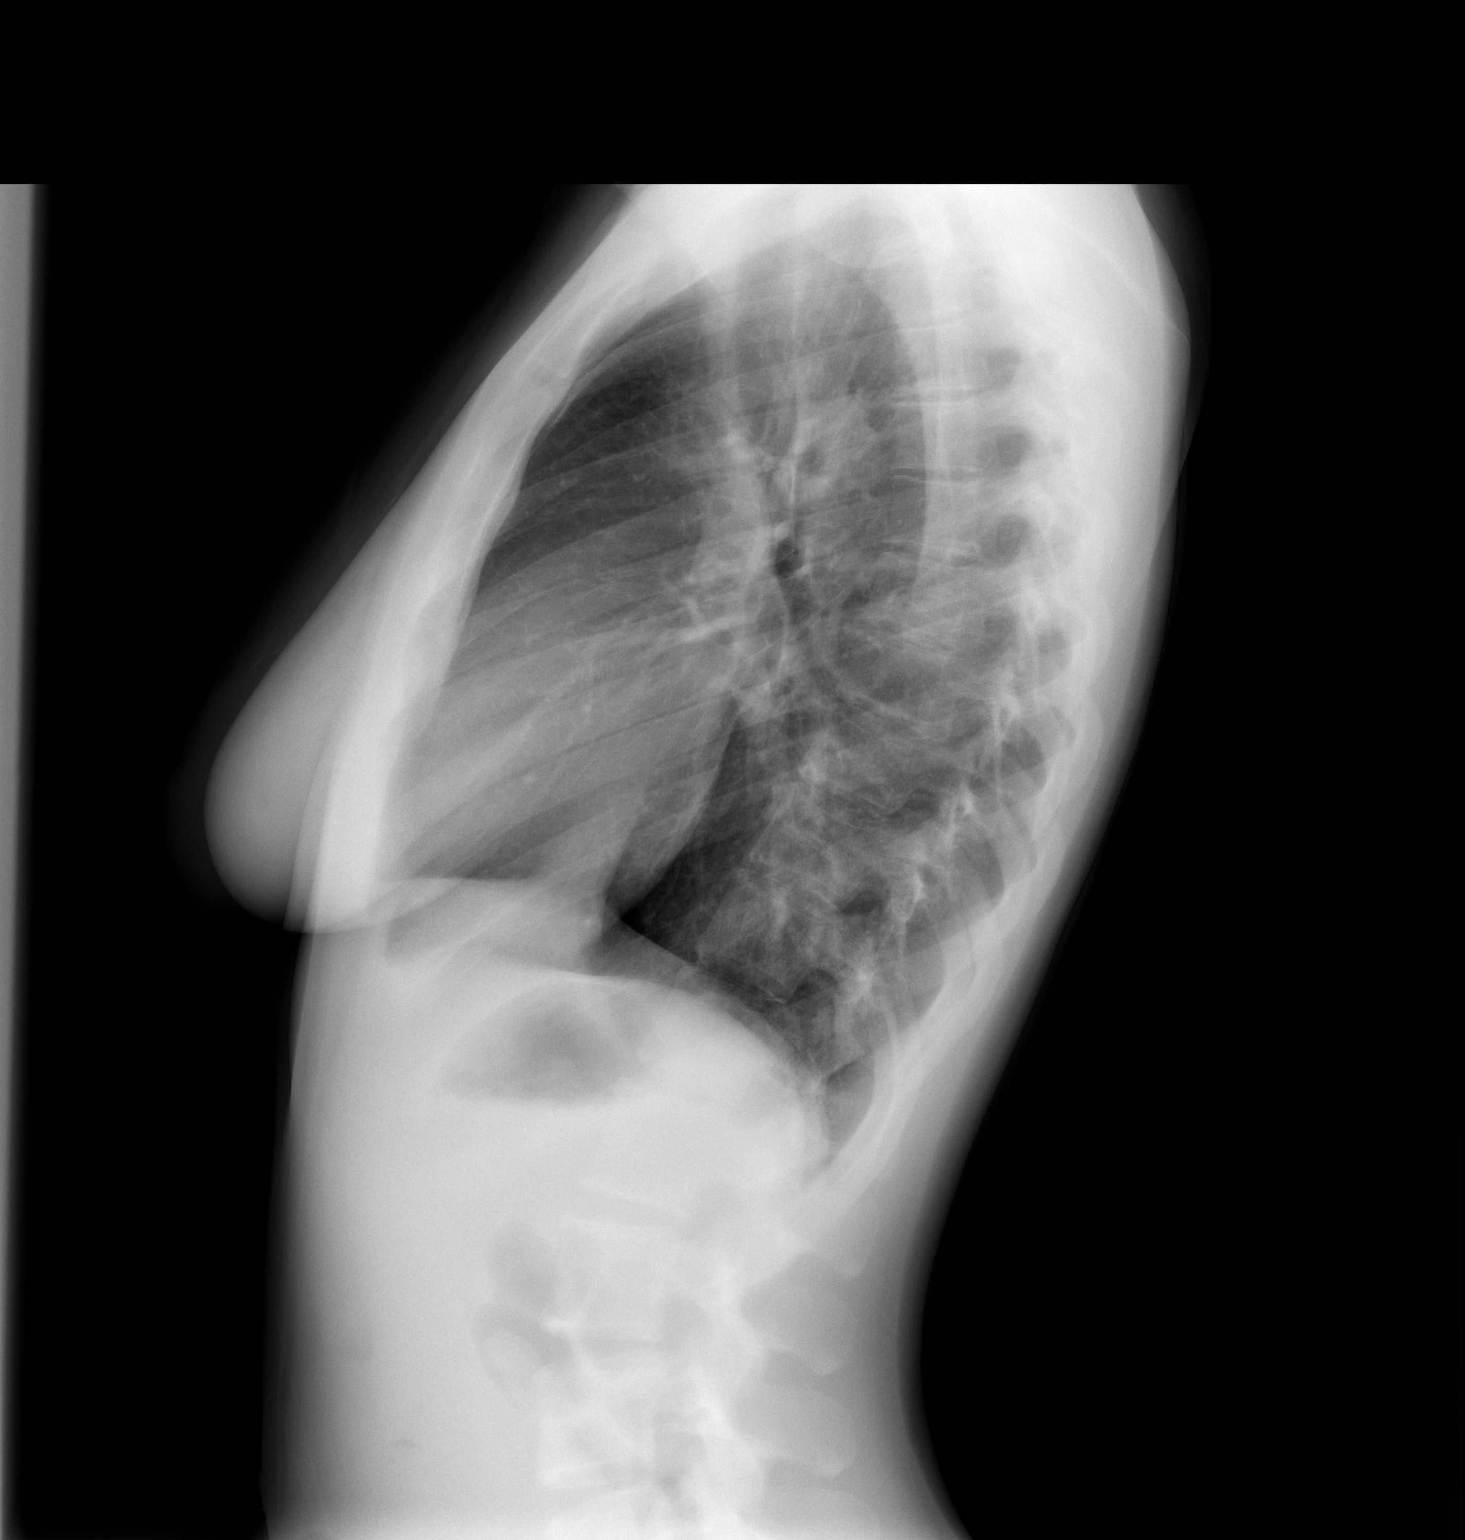

[2 of 2 positions shown; findings below may reference images not displayed]

FINDINGS: Cardiomediastinal silhouette is stable.  No acute
infiltrate or pleural effusion.  No pulmonary edema.  Central mild
bronchitic changes.
IMPRESSION: No acute infiltrate or pulmonary edema.  Central mild bronchitic
changes.

## 2015-12-23 ENCOUNTER — Emergency Department (HOSPITAL_COMMUNITY)
Admission: EM | Admit: 2015-12-23 | Discharge: 2015-12-23 | Disposition: A | Discharge status: Home / Self Care | Payer: Medicaid Other | Attending: Dermatology | Admitting: Dermatology

## 2015-12-23 ENCOUNTER — Encounter (HOSPITAL_COMMUNITY): Payer: Self-pay | Admitting: Emergency Medicine

## 2015-12-23 DIAGNOSIS — Z5321 Procedure and treatment not carried out due to patient leaving prior to being seen by health care provider: Secondary | ICD-10-CM | POA: Diagnosis not present

## 2015-12-23 DIAGNOSIS — R220 Localized swelling, mass and lump, head: Secondary | ICD-10-CM | POA: Diagnosis present

## 2015-12-23 NOTE — ED Notes (Signed)
Patient reports painful right-sided facial swelling for two days. Patient took benadryl yesterday without relief.  Denies trauma/injury or fever.

## 2016-06-02 ENCOUNTER — Encounter (HOSPITAL_COMMUNITY): Payer: Self-pay | Admitting: Emergency Medicine

## 2016-06-02 ENCOUNTER — Emergency Department (HOSPITAL_COMMUNITY): Payer: Medicaid Other

## 2016-06-02 ENCOUNTER — Emergency Department (HOSPITAL_COMMUNITY)
Admission: EM | Admit: 2016-06-02 | Discharge: 2016-06-02 | Disposition: A | Discharge status: Home / Self Care | Payer: Medicaid Other | Attending: Emergency Medicine | Admitting: Emergency Medicine

## 2016-06-02 DIAGNOSIS — O26891 Other specified pregnancy related conditions, first trimester: Secondary | ICD-10-CM | POA: Diagnosis not present

## 2016-06-02 DIAGNOSIS — O131 Gestational [pregnancy-induced] hypertension without significant proteinuria, first trimester: Secondary | ICD-10-CM | POA: Diagnosis not present

## 2016-06-02 DIAGNOSIS — R102 Pelvic and perineal pain: Secondary | ICD-10-CM | POA: Insufficient documentation

## 2016-06-02 DIAGNOSIS — Z3A11 11 weeks gestation of pregnancy: Secondary | ICD-10-CM | POA: Insufficient documentation

## 2016-06-02 DIAGNOSIS — R1030 Lower abdominal pain, unspecified: Secondary | ICD-10-CM

## 2016-06-02 DIAGNOSIS — O4691 Antepartum hemorrhage, unspecified, first trimester: Secondary | ICD-10-CM | POA: Diagnosis present

## 2016-06-02 DIAGNOSIS — R109 Unspecified abdominal pain: Secondary | ICD-10-CM

## 2016-06-02 DIAGNOSIS — Z87891 Personal history of nicotine dependence: Secondary | ICD-10-CM | POA: Insufficient documentation

## 2016-06-02 DIAGNOSIS — Z79899 Other long term (current) drug therapy: Secondary | ICD-10-CM | POA: Diagnosis not present

## 2016-06-02 DIAGNOSIS — N939 Abnormal uterine and vaginal bleeding, unspecified: Secondary | ICD-10-CM

## 2016-06-02 LAB — URINALYSIS, ROUTINE W REFLEX MICROSCOPIC
Bilirubin Urine: NEGATIVE
Glucose, UA: NEGATIVE mg/dL
KETONES UR: NEGATIVE mg/dL
LEUKOCYTES UA: NEGATIVE
NITRITE: NEGATIVE
PROTEIN: 30 mg/dL — AB
Specific Gravity, Urine: 1.036 — ABNORMAL HIGH (ref 1.005–1.030)
pH: 7 (ref 5.0–8.0)

## 2016-06-02 LAB — BASIC METABOLIC PANEL
Anion gap: 5 (ref 5–15)
BUN: 8 mg/dL (ref 6–20)
CALCIUM: 9.3 mg/dL (ref 8.9–10.3)
CO2: 27 mmol/L (ref 22–32)
CREATININE: 0.64 mg/dL (ref 0.44–1.00)
Chloride: 108 mmol/L (ref 101–111)
GFR calc Af Amer: >60 mL/min (ref >60)
Glucose, Bld: 101 mg/dL — ABNORMAL HIGH (ref 65–99)
POTASSIUM: 3.7 mmol/L (ref 3.5–5.1)
SODIUM: 140 mmol/L (ref 135–145)

## 2016-06-02 LAB — URINE MICROSCOPIC-ADD ON

## 2016-06-02 LAB — CBC
HEMATOCRIT: 37.6 % (ref 36.0–46.0)
Hemoglobin: 12.5 g/dL (ref 12.0–15.0)
MCH: 31 pg (ref 26.0–34.0)
MCHC: 33.2 g/dL (ref 30.0–36.0)
MCV: 93.3 fL (ref 78.0–100.0)
PLATELETS: 206 10*3/uL (ref 150–400)
RBC: 4.03 MIL/uL (ref 3.87–5.11)
RDW: 13.3 % (ref 11.5–15.5)
WBC: 6.1 10*3/uL (ref 4.0–10.5)

## 2016-06-02 LAB — ABO/RH: ABO/RH(D): A POS

## 2016-06-02 LAB — HCG, QUANTITATIVE, PREGNANCY: HCG, BETA CHAIN, QUANT, S: 102 m[IU]/mL — AB (ref <5)

## 2016-06-02 LAB — TYPE AND SCREEN
ABO/RH(D): A POS
Antibody Screen: NEGATIVE

## 2016-06-02 LAB — I-STAT BETA HCG BLOOD, ED (MC, WL, AP ONLY): HCG, QUANTITATIVE: 92.8 m[IU]/mL — AB (ref <5)

## 2016-06-02 NOTE — ED Triage Notes (Signed)
Pt states "I passed out this morning, I felt dizzy and lightheaded, I fell down on carpet" denies hitting head. Pt states she woke up right away and was helped back up. Pt c/o stomach pain and cramps for 3 days. Denies n/v. C/o diarrhea. Denies urinary complaints. On period now. Pt in NAD, ambulatory.

## 2016-06-02 NOTE — Discharge Instructions (Signed)
Take tylenol for abdominal pain as needed, avoid ibuprofen Follow up with OB/GYN doctor within 48 hours for further work up Avoid driving if you feel lightheaded or weak  Drink plenty of fluids to ensure you are hydrated

## 2016-06-02 NOTE — ED Notes (Signed)
ED Provider at bedside. 

## 2016-06-02 NOTE — ED Provider Notes (Signed)
[facility]-EMERGENCY DEPT Provider Note   CSN: [id number] Arrival date & time: 06/02/16  1250  History   Chief Complaint Chief Complaint  Patient presents with  . Abdominal Pain  . Loss of Consciousness    HPI Cheyenne Hahn is a 25 y.o. female with the below pmh who is here today for sharp low pelvic pain x 1 day.  Pt has had 2 months of intermittent heavy vaginal bleeding.  Her normal LMP was 03/14/2016.  Pt has had 2 episodes of diarrhea since pelvic pain started last night. Pt had a syncopal episode this morning, witnessed by her 26 year old daughter.  Pt remembers feeling lightheaded and weak before she passed out.  She then remembers waking up on the ground, she denies head injury, confusion, incontinence or tongue biting.  She is sexually active without birth control.  Denies blurry vision, facial drooping, chest pain, sob, nausea, vomiting, numbness/tingling in extremities.  Pt has had similar syncopal episodes when she was pregnant.   HPI  Past Medical History:  Diagnosis Date  . Anemia   . No pertinent past medical history   . Pregnancy induced hypertension     Patient Active Problem List   Diagnosis Date Noted  . Indication for care in labor or delivery 02/08/2014  . Normal delivery 02/08/2014  . Normal pregnancy 03/11/2011  . Decreased fetal movement 03/11/2011    Past Surgical History:  Procedure Laterality Date  . NO PAST SURGERIES    . WISDOM TOOTH EXTRACTION      OB History    Gravida Para Term Preterm AB Living   2 2 2  0 0 2   SAB TAB Ectopic Multiple Live Births   0 0 0 0 2       Home Medications    Prior to Admission medications   Medication Sig Start Date End Date Taking? Authorizing Provider  acetaminophen (TYLENOL) 325 MG tablet Take 650 mg by mouth every 6 (six) hours as needed for mild pain.   Yes Historical Provider, MD  amoxicillin (AMOXIL) 500 MG capsule Take 1 capsule (500 mg total) by mouth 3 (three) times daily. Patient not taking:  Reported on 06/02/2016 05/26/15   Elson AreasLeslie K Sofia, PA-C  HYDROcodone-acetaminophen (NORCO/VICODIN) 5-325 MG tablet Take 2 tablets by mouth every 4 (four) hours as needed. Patient not taking: Reported on 06/02/2016 05/26/15   Elson AreasLeslie K Sofia, PA-C  ibuprofen (ADVIL,MOTRIN) 600 MG tablet Take 1 tablet (600 mg total) by mouth every 6 (six) hours. Patient not taking: Reported on 06/02/2016 02/09/14   Lanice ShirtsAmy H Wren, CNM    Family History Family History  Problem Relation Age of Onset  . Hypertension Mother   . Hypertension Maternal Aunt   . Hypertension Maternal Uncle   . Hypertension Maternal Grandmother   . Diabetes Maternal Grandmother     Social History Social History  Substance Use Topics  . Smoking status: Former Smoker    Quit date: 05/02/2012  . Smokeless tobacco: Never Used     Comment: 2011  . Alcohol use Yes     Allergies   Review of patient's allergies indicates no known allergies.   Review of Systems Review of Systems  Constitutional: Negative.   HENT: Negative.   Eyes: Negative.   Respiratory: Negative.   Cardiovascular: Negative.   Gastrointestinal: Positive for diarrhea.  Endocrine: Negative.   Genitourinary: Positive for pelvic pain and vaginal bleeding (intermittent, heavy vaginal bleeding x 2 months).  Musculoskeletal: Negative.   Skin: Negative.  Neurological: Negative.   Psychiatric/Behavioral: Negative.      Physical Exam Updated Vital Signs BP 122/83 (BP Location: Right Arm)   Pulse 67   Temp 99 F (37.2 C) (Oral)   Resp 13   Ht 5\' 3"  (1.6 m)   Wt 55.8 kg   LMP 06/02/2016   SpO2 100%   BMI 21.79 kg/m   Physical Exam  Constitutional: She is oriented to person, place, and time. She appears well-developed and well-nourished. No distress.  HENT:  Head: Normocephalic and atraumatic.  Mouth/Throat: Oropharynx is clear and moist.  Eyes: Conjunctivae are normal. Pupils are equal, round, and reactive to light.  Neck: Normal range of motion.    Cardiovascular: Normal rate, regular rhythm, normal heart sounds and intact distal pulses.   No murmur heard. Pulmonary/Chest: Effort normal and breath sounds normal. No respiratory distress.  Abdominal: Soft. Bowel sounds are normal. She exhibits no distension and no mass. There is tenderness (pelvic tendrness to deep palpation).  Musculoskeletal: She exhibits no edema.  Lymphadenopathy:    She has no cervical adenopathy.  Neurological: She is alert and oriented to person, place, and time.  Skin: Skin is warm and dry. Capillary refill takes less than 2 seconds.  Psychiatric: She has a normal mood and affect.   ED Treatments / Results  Labs (all labs ordered are listed, but only abnormal results are displayed) Labs Reviewed  BASIC METABOLIC PANEL - Abnormal; Notable for the following:       Result Value   Glucose, Bld 101 (*)    All other components within normal limits  URINALYSIS, ROUTINE W REFLEX MICROSCOPIC (NOT AT [facility]) - Abnormal; Notable for the following:    Color, Urine AMBER (*)    Specific Gravity, Urine 1.036 (*)    Hgb urine dipstick LARGE (*)    Protein, ur 30 (*)    All other components within normal limits  HCG, QUANTITATIVE, PREGNANCY - Abnormal; Notable for the following:    hCG, Beta Chain, Quant, S 102 (*)    All other components within normal limits  URINE MICROSCOPIC-ADD ON - Abnormal; Notable for the following:    Squamous Epithelial / LPF 6-30 (*)    Bacteria, UA FEW (*)    All other components within normal limits  I-STAT BETA HCG BLOOD, ED ([facility], [facility], [facility] ONLY) - Abnormal; Notable for the following:    I-stat hCG, quantitative 92.8 (*)    All other components within normal limits  CBC  TYPE AND SCREEN  ABO/RH    EKG  EKG Interpretation  Date/Time:  Tuesday June 02 2016 13:17:20 EDT Ventricular Rate:  87 PR Interval:  128 QRS Duration: 68 QT Interval:  358 QTC Calculation: 430 R Axis:   74 Text Interpretation:  Normal sinus rhythm with sinus  arrhythmia Normal ECG No STEMI Confirmed by LONG MD, JOSHUA ([id number]) on 06/03/2016 4:02:21 PM       Radiology [place] Ob Comp Less 14 Wks  Result Date: 06/02/2016 CLINICAL DATA:  Pregnant, abdominal pain, syncope, question ectopic pregnancy; quantitative beta HCG = 93 EXAM: OBSTETRIC <14 WK US AND TRANSVAGINAL OB US TECHNIQUE: Both transabdominal and transvaginal ultrasound examinations were performed for complete evaluation of the gestation as well as the maternal uterus, adnexal regions, and pelvic cul-de-sac. Transvaginal technique was performed to assess early pregnancy. COMPARISON:  None. FINDINGS: Intrauterine gestational sac: Not identified Yolk sac:  N/A Embryo:  N/A Cardiac Activity: N/A Heart Rate: N/A  bpm Subchorionic hemorrhage:  N/A Maternal  uterus/adnexae: Uterus normal size and morphology without mass. Endometrial complex unremarkable. No endometrial fluid or focal fluid collection to suggest gestational sac. LEFT ovary normal size and morphology, 3.6 x 3.8 x 1.9 cm. RIGHT ovary normal size and morphology, 4.8 x 4.1 x 2.5 cm. Trace free pelvic fluid. No adnexal masses. IMPRESSION: No intrauterine gestation identified. Findings are compatible with pregnancy of unknown location. Differential diagnosis includes early intrauterine pregnancy too early to visualize, spontaneous abortion, and ectopic pregnancy. Serial quantitative beta HCG and or followup ultrasound recommended to definitively exclude ectopic pregnancy. Electronically Signed   By: Ulyses Southward M.D.   On: 06/02/2016 15:29   US Ob Transvaginal  Result Date: 06/02/2016 CLINICAL DATA:  Pregnant, abdominal pain, syncope, question ectopic pregnancy; quantitative beta HCG = 93 EXAM: OBSTETRIC <14 WK [place] AND TRANSVAGINAL OB US TECHNIQUE: Both transabdominal and transvaginal ultrasound examinations were performed for complete evaluation of the gestation as well as the maternal uterus, adnexal regions, and pelvic cul-de-sac. Transvaginal  technique was performed to assess early pregnancy. COMPARISON:  None. FINDINGS: Intrauterine gestational sac: Not identified Yolk sac:  N/A Embryo:  N/A Cardiac Activity: N/A Heart Rate: N/A  bpm Subchorionic hemorrhage:  N/A Maternal uterus/adnexae: Uterus normal size and morphology without mass. Endometrial complex unremarkable. No endometrial fluid or focal fluid collection to suggest gestational sac. LEFT ovary normal size and morphology, 3.6 x 3.8 x 1.9 cm. RIGHT ovary normal size and morphology, 4.8 x 4.1 x 2.5 cm. Trace free pelvic fluid. No adnexal masses. IMPRESSION: No intrauterine gestation identified. Findings are compatible with pregnancy of unknown location. Differential diagnosis includes early intrauterine pregnancy too early to visualize, spontaneous abortion, and ectopic pregnancy. Serial quantitative beta HCG and or followup ultrasound recommended to definitively exclude ectopic pregnancy. Electronically Signed   By: Ulyses Southward M.D.   On: 06/02/2016 15:29    Procedures Procedures (including critical care time)  Medications Ordered in ED Medications - No data to display   Initial Impression / Assessment and Plan / ED Course  I have reviewed the triage vital signs and the nursing notes.  Pertinent labs & imaging results that were available during my care of the patient were reviewed by me and considered in my medical decision making (see chart for details).  Clinical Course   25 y.o G2P2 female with pmh of anemia and pregnancy induced HTN presented to ED after syncopal episode this morning associated with 2 months of heavy, intermittent vaginal bleeding and pelvic pain.  Pt is sexually active without birth control.  Ddx includes pregnancy, missed abortion, ectopic pregnancy.  EKG, CBC, BMP, U/A negative today, HgB 12.65 and glucose at 101.  I-stat beta HCG was was positive at 92.8 and quantitative HCG at 102.   No intrauterine gestation was identified on U/S OB ultrasound and  transvaginal U/S.  ED work up supports ddx of ectopic pregnancy, early pregnancy unable to be visualized on U/S or missed spontaneous abortion.  Pt's vitals remained stable throughout ED stay, tylenol was used for pelvic pain.  Serial HCG is needed within 48 hrs to determine etiology of pelvic pain and vaginal bleeding.  I discussed ED work up and 48 hr follow up needed with pt, addressed pt questions and concerns.  Pt agreeable.  Instructed pt to stay hydrated, take tylenol for pelvic pain and follow up with OB within 48 hrs for serial HCG.  Pt asked for OB/GYN resources in the area to follow up with. Pt discharged with resources to establish OBGYN care.  Final Clinical Impressions(s) / ED Diagnoses   Final diagnoses:  Lower abdominal pain  Vaginal bleeding    New Prescriptions Discharge Medication List as of 06/02/2016  4:33 PM       Liberty Handy, PA-C 06/04/16 1042    Eber Hong, MD 06/04/16 1047    Eber Hong, MD 06/04/16 1049    Eber Hong, MD 06/04/16 1056

## 2016-06-04 ENCOUNTER — Encounter (HOSPITAL_COMMUNITY): Payer: Self-pay | Admitting: *Deleted

## 2016-06-04 ENCOUNTER — Inpatient Hospital Stay (HOSPITAL_COMMUNITY)
Admission: AD | Admit: 2016-06-04 | Discharge: 2016-06-04 | Disposition: A | Discharge status: Home / Self Care | Payer: Medicaid Other | Source: Ambulatory Visit | Attending: Obstetrics & Gynecology | Admitting: Obstetrics & Gynecology

## 2016-06-04 DIAGNOSIS — O4690 Antepartum hemorrhage, unspecified, unspecified trimester: Secondary | ICD-10-CM | POA: Insufficient documentation

## 2016-06-04 DIAGNOSIS — Z87891 Personal history of nicotine dependence: Secondary | ICD-10-CM | POA: Insufficient documentation

## 2016-06-04 DIAGNOSIS — O039 Complete or unspecified spontaneous abortion without complication: Secondary | ICD-10-CM

## 2016-06-04 DIAGNOSIS — Z79899 Other long term (current) drug therapy: Secondary | ICD-10-CM | POA: Insufficient documentation

## 2016-06-04 LAB — HCG, QUANTITATIVE, PREGNANCY: HCG, BETA CHAIN, QUANT, S: 28 m[IU]/mL — AB (ref <5)

## 2016-06-04 NOTE — MAU Note (Signed)
Pt is here for follow up [facility]CG [facility], states she is still bleeding and is still having pain but the pain is better than before and bleeding is about the same.

## 2016-06-04 NOTE — Discharge Instructions (Signed)
Miscarriage  A miscarriage is the sudden loss of an unborn baby (fetus) before the 20th week of pregnancy. Most miscarriages happen in the first 3 months of pregnancy. Sometimes, it happens before a woman even knows she is pregnant. A miscarriage is also called a "spontaneous miscarriage" or "early pregnancy loss." Having a miscarriage can be an emotional experience. Talk with your caregiver about any questions you may have about miscarrying, the grieving process, and your future pregnancy plans.  CAUSES    Problems with the fetal chromosomes that make it impossible for the baby to develop normally. Problems with the baby's genes or chromosomes are most often the result of errors that occur, by chance, as the embryo divides and grows. The problems are not inherited from the parents.   Infection of the cervix or uterus.    Hormone problems.    Problems with the cervix, such as having an incompetent cervix. This is when the tissue in the cervix is not strong enough to hold the pregnancy.    Problems with the uterus, such as an abnormally shaped uterus, uterine fibroids, or congenital abnormalities.    Certain medical conditions.    Smoking, drinking alcohol, or taking illegal drugs.    Trauma.   Often, the cause of a miscarriage is unknown.   SYMPTOMS    Vaginal bleeding or spotting, with or without cramps or pain.   Pain or cramping in the abdomen or lower back.   Passing fluid, tissue, or blood clots from the vagina.  DIAGNOSIS   Your caregiver will perform a physical exam. You may also have an ultrasound to confirm the miscarriage. Blood or urine tests may also be ordered.  TREATMENT    Sometimes, treatment is not necessary if you naturally pass all the fetal tissue that was in the uterus. If some of the fetus or placenta remains in the body (incomplete miscarriage), tissue left behind may become infected and must be removed. Usually, a dilation and curettage (D and C) procedure is performed.  During a D and C procedure, the cervix is widened (dilated) and any remaining fetal or placental tissue is gently removed from the uterus.   Antibiotic medicines are prescribed if there is an infection. Other medicines may be given to reduce the size of the uterus (contract) if there is a lot of bleeding.   If you have Rh negative blood and your baby was Rh positive, you will need a Rh immunoglobulin shot. This shot will protect any future baby from having Rh blood problems in future pregnancies.  HOME CARE INSTRUCTIONS    Your caregiver may order bed rest or may allow you to continue light activity. Resume activity as directed by your caregiver.   Have someone help with home and family responsibilities during this time.    Keep track of the number of sanitary pads you use each day and how soaked (saturated) they are. Write down this information.    Do not use tampons. Do not douche or have sexual intercourse until approved by your caregiver.    Only take over-the-counter or prescription medicines for pain or discomfort as directed by your caregiver.    Do not take aspirin. Aspirin can cause bleeding.    Keep all follow-up appointments with your caregiver.    If you or your partner have problems with grieving, talk to your caregiver or seek counseling to help cope with the pregnancy loss. Allow enough time to grieve before trying to get pregnant again.     SEEK IMMEDIATE MEDICAL CARE IF:    You have severe cramps or pain in your back or abdomen.   You have a fever.   You pass large blood clots (walnut-sized or larger) ortissue from your vagina. Save any tissue for your caregiver to inspect.    Your bleeding increases.    You have a thick, bad-smelling vaginal discharge.   You become lightheaded, weak, or you faint.    You have chills.   MAKE SURE YOU:   Understand these instructions.   Will watch your condition.   Will get help right away if you are not doing well or get worse.     This  information is not intended to replace advice given to you by your health care provider. Make sure you discuss any questions you have with your health care provider.     Document Released: 01/13/2001 Document Revised: 11/14/2012 Document Reviewed: 09/08/2011  Elsevier Interactive Patient Education 2016 [employer]

## 2016-06-04 NOTE — MAU Provider Note (Signed)
History     CSN: [id number]  Arrival date and time: 06/04/16 1902   None     Chief Complaint  Patient presents with  . Vaginal Bleeding   Vaginal Bleeding  The patient's primary symptoms include pelvic pain and vaginal bleeding. This is a new problem. The current episode started in the past 7 days. The problem occurs intermittently. The problem has been resolved. The patient is experiencing no pain. She is pregnant. Pertinent negatives include no abdominal pain, chills, constipation, diarrhea, dysuria, fever, frequency, nausea, urgency or vomiting. There has been no bleeding. Nothing aggravates the symptoms. She has tried nothing for the symptoms. Menstrual history: LMP 05/14/16     Past Medical History:  Diagnosis Date  . Anemia   . No pertinent past medical history   . Pregnancy induced hypertension     Past Surgical History:  Procedure Laterality Date  . NO PAST SURGERIES    . WISDOM TOOTH EXTRACTION      Family History  Problem Relation Age of Onset  . Hypertension Mother   . Hypertension Maternal Aunt   . Hypertension Maternal Uncle   . Hypertension Maternal Grandmother   . Diabetes Maternal Grandmother     Social History  Substance Use Topics  . Smoking status: Former Smoker    Quit date: 05/02/2012  . Smokeless tobacco: Never Used     Comment: 2011  . Alcohol use Yes    Allergies: No Known Allergies  Prescriptions Prior to Admission  Medication Sig Dispense Refill Last Dose  . acetaminophen (TYLENOL) 325 MG tablet Take 650 mg by mouth every 6 (six) hours as needed for mild pain.   06/02/2016 at Unknown time  . amoxicillin (AMOXIL) 500 MG capsule Take 1 capsule (500 mg total) by mouth 3 (three) times daily. (Patient not taking: Reported on 06/02/2016) 21 capsule 0 Completed Course at Unknown time  . HYDROcodone-acetaminophen (NORCO/VICODIN) 5-325 MG tablet Take 2 tablets by mouth every 4 (four) hours as needed. (Patient not taking: Reported on 06/02/2016) 6  tablet 0 Completed Course at Unknown time  . ibuprofen (ADVIL,MOTRIN) 600 MG tablet Take 1 tablet (600 mg total) by mouth every 6 (six) hours. (Patient not taking: Reported on 06/02/2016) 30 tablet 0 Not Taking at Unknown time    Review of Systems  Constitutional: Negative for chills and fever.  Gastrointestinal: Negative for abdominal pain, constipation, diarrhea, nausea and vomiting.  Genitourinary: Positive for pelvic pain and vaginal bleeding. Negative for dysuria, frequency and urgency.   Physical Exam   Blood pressure 117/80, pulse 72, temperature 98.6 F (37 C), temperature source Oral, resp. rate 16, last menstrual period 05/14/2016, SpO2 100 %, unknown if currently breastfeeding.  Physical Exam  Nursing note and vitals reviewed. Constitutional: She is oriented to person, place, and time. She appears well-developed and well-nourished. No distress.  HENT:  Head: Normocephalic.  Cardiovascular: Normal rate.   Respiratory: Effort normal.  GI: Soft. There is no tenderness. There is no rebound.  Neurological: She is alert and oriented to person, place, and time.  Skin: Skin is warm and dry.  Psychiatric: She has a normal mood and affect.   Results for Helyn AppSHUBERT, Derika B (MRN [id number]) as of 06/04/2016 20:14  Ref. Range 06/02/2016 15:35 06/02/2016 15:41 06/02/2016 15:41 06/03/2016 11:44 06/04/2016 19:25  HCG, Beta Chain, Quant, S Latest Ref Range: <5 mIU/mL 102 (H)    28 (H)   MAU Course  Procedures  MDM   Assessment and Plan   1. Spontaneous  abortion    DC home Comfort measures reviewed  Bleeding precautions RX: none Return to MAU as needed   Follow-up Information    [facility]   Specialty:  Obstetrics and Gynecology Why:  FRIDAY 06/12/16 at 8:00 am for repeat blood work  Contact information: [address] [place] [id number] [phone]           Tawnya CrookHogan, Delise Simenson Donovan 06/04/2016, 8:17 PM

## 2016-06-12 ENCOUNTER — Ambulatory Visit: Payer: Medicaid Other

## 2016-09-10 ENCOUNTER — Encounter (HOSPITAL_COMMUNITY): Payer: Self-pay | Admitting: Emergency Medicine

## 2016-09-10 ENCOUNTER — Observation Stay (HOSPITAL_COMMUNITY)
Admission: EM | Admit: 2016-09-10 | Discharge: 2016-09-11 | Disposition: A | Discharge status: Home / Self Care | Payer: Medicaid Other | Attending: Internal Medicine | Admitting: Internal Medicine

## 2016-09-10 DIAGNOSIS — Z79899 Other long term (current) drug therapy: Secondary | ICD-10-CM | POA: Insufficient documentation

## 2016-09-10 DIAGNOSIS — F1210 Cannabis abuse, uncomplicated: Secondary | ICD-10-CM | POA: Insufficient documentation

## 2016-09-10 DIAGNOSIS — F32A Depression, unspecified: Secondary | ICD-10-CM | POA: Diagnosis present

## 2016-09-10 DIAGNOSIS — Z87891 Personal history of nicotine dependence: Secondary | ICD-10-CM | POA: Insufficient documentation

## 2016-09-10 DIAGNOSIS — R400 Somnolence: Secondary | ICD-10-CM | POA: Insufficient documentation

## 2016-09-10 DIAGNOSIS — K219 Gastro-esophageal reflux disease without esophagitis: Secondary | ICD-10-CM

## 2016-09-10 DIAGNOSIS — T50901A Poisoning by unspecified drugs, medicaments and biological substances, accidental (unintentional), initial encounter: Secondary | ICD-10-CM | POA: Diagnosis present

## 2016-09-10 DIAGNOSIS — F329 Major depressive disorder, single episode, unspecified: Secondary | ICD-10-CM | POA: Diagnosis present

## 2016-09-10 DIAGNOSIS — R1013 Epigastric pain: Secondary | ICD-10-CM

## 2016-09-10 DIAGNOSIS — R109 Unspecified abdominal pain: Secondary | ICD-10-CM | POA: Insufficient documentation

## 2016-09-10 DIAGNOSIS — T50904A Poisoning by unspecified drugs, medicaments and biological substances, undetermined, initial encounter: Secondary | ICD-10-CM

## 2016-09-10 DIAGNOSIS — T391X4A Poisoning by 4-Aminophenol derivatives, undetermined, initial encounter: Principal | ICD-10-CM

## 2016-09-10 LAB — CBC WITH DIFFERENTIAL/PLATELET
BASOS PCT: 0 %
Basophils Absolute: 0 10*3/uL (ref 0.0–0.1)
EOS ABS: 0 10*3/uL (ref 0.0–0.7)
Eosinophils Relative: 0 %
HCT: 38.4 % (ref 36.0–46.0)
HEMOGLOBIN: 12.9 g/dL (ref 12.0–15.0)
LYMPHS ABS: 1.9 10*3/uL (ref 0.7–4.0)
Lymphocytes Relative: 28 %
MCH: 29.9 pg (ref 26.0–34.0)
MCHC: 33.6 g/dL (ref 30.0–36.0)
MCV: 89.1 fL (ref 78.0–100.0)
Monocytes Absolute: 0.4 10*3/uL (ref 0.1–1.0)
Monocytes Relative: 6 %
NEUTROS ABS: 4.6 10*3/uL (ref 1.7–7.7)
NEUTROS PCT: 66 %
Platelets: 261 10*3/uL (ref 150–400)
RBC: 4.31 MIL/uL (ref 3.87–5.11)
RDW: 13.2 % (ref 11.5–15.5)
WBC: 7 10*3/uL (ref 4.0–10.5)

## 2016-09-10 LAB — APTT: aPTT: 30 seconds (ref 24–36)

## 2016-09-10 LAB — RAPID URINE DRUG SCREEN, HOSP PERFORMED
Amphetamines: NOT DETECTED
BENZODIAZEPINES: NOT DETECTED
Barbiturates: NOT DETECTED
COCAINE: NOT DETECTED
OPIATES: NOT DETECTED
TETRAHYDROCANNABINOL: POSITIVE — AB

## 2016-09-10 LAB — ACETAMINOPHEN LEVEL: Acetaminophen (Tylenol), Serum: 25 ug/mL (ref 10–30)

## 2016-09-10 LAB — COMPREHENSIVE METABOLIC PANEL
ALBUMIN: 4.2 g/dL (ref 3.5–5.0)
ALK PHOS: 37 U/L — AB (ref 38–126)
ALT: 10 U/L — AB (ref 14–54)
AST: 18 U/L (ref 15–41)
Anion gap: 8 (ref 5–15)
BUN: 7 mg/dL (ref 6–20)
CALCIUM: 9.5 mg/dL (ref 8.9–10.3)
CO2: 27 mmol/L (ref 22–32)
CREATININE: 0.74 mg/dL (ref 0.44–1.00)
Chloride: 107 mmol/L (ref 101–111)
GFR calc Af Amer: >60 mL/min (ref >60)
GFR calc non Af Amer: >60 mL/min (ref >60)
Glucose, Bld: 79 mg/dL (ref 65–99)
Potassium: 3.6 mmol/L (ref 3.5–5.1)
SODIUM: 142 mmol/L (ref 135–145)
Total Bilirubin: 0.7 mg/dL (ref 0.3–1.2)
Total Protein: 7.6 g/dL (ref 6.5–8.1)

## 2016-09-10 LAB — URINALYSIS, ROUTINE W REFLEX MICROSCOPIC
BACTERIA UA: NONE SEEN
BILIRUBIN URINE: NEGATIVE
Glucose, UA: NEGATIVE mg/dL
KETONES UR: NEGATIVE mg/dL
LEUKOCYTES UA: NEGATIVE
Nitrite: NEGATIVE
Protein, ur: NEGATIVE mg/dL
SPECIFIC GRAVITY, URINE: 1.008 (ref 1.005–1.030)
pH: 6 (ref 5.0–8.0)

## 2016-09-10 LAB — MAGNESIUM: Magnesium: 1.6 mg/dL — ABNORMAL LOW (ref 1.7–2.4)

## 2016-09-10 LAB — PROTIME-INR
INR: 1.09
Prothrombin Time: 14.2 seconds (ref 11.4–15.2)

## 2016-09-10 LAB — CBC
HCT: 32.5 % — ABNORMAL LOW (ref 36.0–46.0)
HEMOGLOBIN: 11 g/dL — AB (ref 12.0–15.0)
MCH: 30.2 pg (ref 26.0–34.0)
MCHC: 33.8 g/dL (ref 30.0–36.0)
MCV: 89.3 fL (ref 78.0–100.0)
PLATELETS: 205 10*3/uL (ref 150–400)
RBC: 3.64 MIL/uL — ABNORMAL LOW (ref 3.87–5.11)
RDW: 13.3 % (ref 11.5–15.5)
WBC: 5.3 10*3/uL (ref 4.0–10.5)

## 2016-09-10 LAB — TSH: TSH: 1.423 u[IU]/mL (ref 0.350–4.500)

## 2016-09-10 LAB — CREATININE, SERUM
CREATININE: 0.65 mg/dL (ref 0.44–1.00)
GFR calc Af Amer: >60 mL/min (ref >60)
GFR calc non Af Amer: >60 mL/min (ref >60)

## 2016-09-10 LAB — I-STAT BETA HCG BLOOD, ED (MC, WL, AP ONLY)

## 2016-09-10 LAB — PHOSPHORUS: PHOSPHORUS: 2.6 mg/dL (ref 2.5–4.6)

## 2016-09-10 LAB — ETHANOL: Alcohol, Ethyl (B): 6 mg/dL — ABNORMAL HIGH (ref <5)

## 2016-09-10 LAB — SALICYLATE LEVEL: Salicylate Lvl: <7 mg/dL (ref 2.8–30.0)

## 2016-09-10 LAB — MRSA PCR SCREENING: MRSA by PCR: NEGATIVE

## 2016-09-10 MED ORDER — PANTOPRAZOLE SODIUM 40 MG PO TBEC
40.0000 mg | DELAYED_RELEASE_TABLET | Freq: Every day | ORAL | Status: DC
  Administered 2016-09-10 – 2016-09-11 (×2): 40 mg via ORAL
  Filled 2016-09-10 (×2): qty 1

## 2016-09-10 MED ORDER — ONDANSETRON HCL 4 MG PO TABS: 4.0000 mg | ORAL_TABLET | Freq: Four times a day (QID) | ORAL | Status: DC | PRN

## 2016-09-10 MED ORDER — ACETYLCYSTEINE LOAD VIA INFUSION
150.0000 mg/kg | Freq: Once | INTRAVENOUS | Status: DC
  Filled 2016-09-10: qty 196

## 2016-09-10 MED ORDER — ADULT MULTIVITAMIN W/MINERALS CH
1.0000 | ORAL_TABLET | Freq: Every day | ORAL | Status: DC
  Administered 2016-09-10 – 2016-09-11 (×2): 1 via ORAL
  Filled 2016-09-10 (×2): qty 1

## 2016-09-10 MED ORDER — ONDANSETRON HCL 4 MG/2ML IJ SOLN: 4.0000 mg | Freq: Four times a day (QID) | INTRAMUSCULAR | Status: DC | PRN

## 2016-09-10 MED ORDER — NALOXONE HCL 0.4 MG/ML IJ SOLN
0.4000 mg | Freq: Once | INTRAMUSCULAR | Status: AC
  Administered 2016-09-10: 0.4 mg via INTRAVENOUS
  Filled 2016-09-10: qty 1

## 2016-09-10 MED ORDER — ACETYLCYSTEINE LOAD VIA INFUSION: 150.0000 mg/kg | Freq: Once | INTRAVENOUS | Status: DC

## 2016-09-10 MED ORDER — SODIUM CHLORIDE 0.9 % IV SOLN
INTRAVENOUS | Status: DC
  Administered 2016-09-10 – 2016-09-11 (×2): via INTRAVENOUS

## 2016-09-10 MED ORDER — DEXTROSE 5 % IV SOLN
15.0000 mg/kg/h | INTRAVENOUS | Status: DC
  Administered 2016-09-10: 15 mg/kg/h via INTRAVENOUS
  Filled 2016-09-10: qty 150

## 2016-09-10 MED ORDER — DEXTROSE 5 % IV SOLN: 15.0000 mg/kg/h | INTRAVENOUS | Status: DC

## 2016-09-10 MED ORDER — SODIUM CHLORIDE 0.9 % IV BOLUS (SEPSIS)
1000.0000 mL | Freq: Once | INTRAVENOUS | Status: AC
  Administered 2016-09-10: 1000 mL via INTRAVENOUS

## 2016-09-10 MED ORDER — HEPARIN SODIUM (PORCINE) 5000 UNIT/ML IJ SOLN
5000.0000 [IU] | Freq: Three times a day (TID) | INTRAMUSCULAR | Status: DC
  Administered 2016-09-10 – 2016-09-11 (×2): 5000 [IU] via SUBCUTANEOUS
  Filled 2016-09-10 (×2): qty 1

## 2016-09-10 NOTE — ED Provider Notes (Signed)
[facility]-EMERGENCY DEPT Provider Note   CSN: [id number] Arrival date & time: 09/10/16  1313     History   Chief Complaint Chief Complaint  Patient presents with  . Ingestion    HPI   Blood pressure 133/100, pulse 68, temperature 98.6 F (37 C), temperature source Oral, resp. rate 18, height 5\' 3"  (1.6 m), weight 52.2 kg, last menstrual period 08/24/2016, SpO2 99 %, unknown if currently breastfeeding.   Cheyenne Hahn is a 26 y.o. female heart in by EMS for overdose. Patient states that she wanted to sleep this morning so she took oxycodone, Tylenol Extra Strength (a handful) Sudafed, cetirizine (3 empty Cetrizine HCL 10 mg tablets were inside tylenol box). EMS found a M.D. box of Tylenol Cold and flu severe. Patient reports that she feels tired but no emesis and some mild abdominal pain. She denies suicide attempt. States that she has had issues sleeping and she just wants to go to bed. He states that she doesn't know how she got to the emergency department.  Mother phone call at 11:45 wasn't feeling well. Said I need you. Went to house in 17 minutes and saw empty pill boxes and called 911. Did have any psychiatric history or history of prior suicide attempts. She did not endorse suicidal ideation to her mother but told her the same thing that she just wanted to sleep. Mother is not taking out an involuntary commitment paperwork on this patient. She's here voluntarily.  Past Medical History:  Diagnosis Date  . Anemia   . No pertinent past medical history   . Pregnancy induced hypertension     Patient Active Problem List   Diagnosis Date Noted  . Medication overdose 09/10/2016  . Indication for care in labor or delivery 02/08/2014  . Normal delivery 02/08/2014  . Normal pregnancy 03/11/2011  . Decreased fetal movement 03/11/2011    Past Surgical History:  Procedure Laterality Date  . NO PAST SURGERIES    . WISDOM TOOTH EXTRACTION      OB History    Gravida Para Term Preterm  AB Living   3 2 2  0 0 2   SAB TAB Ectopic Multiple Live Births   0 0 0 0 2       Home Medications    Prior to Admission medications   Medication Sig Start Date End Date Taking? Authorizing Provider  acetaminophen (TYLENOL) 500 MG tablet Take 500 mg by mouth every 6 (six) hours as needed for mild pain, moderate pain, fever or headache.   Yes Historical Provider, MD  cetirizine (ZYRTEC) 10 MG tablet Take 30 mg by mouth once.   Yes Historical Provider, MD  oxyCODONE-acetaminophen (PERCOCET) 10-325 MG tablet Take 2 tablets by mouth once.   Yes Historical Provider, MD  phenylephrine (SUDAFED PE) 10 MG TABS tablet Take 40 mg by mouth every 4 (four) hours as needed (for cold).   Yes Historical Provider, MD  Phenylephrine-DM-GG-APAP (TYLENOL COLD/FLU SEVERE) 5-10-200-325 MG TABS Take 12 tablets by mouth once.   Yes Historical Provider, MD    Family History Family History  Problem Relation Age of Onset  . Hypertension Mother   . Hypertension Maternal Aunt   . Hypertension Maternal Uncle   . Hypertension Maternal Grandmother   . Diabetes Maternal Grandmother     Social History Social History  Substance Use Topics  . Smoking status: Former Smoker    Quit date: 05/02/2012  . Smokeless tobacco: Never Used     Comment: 2011  .  Alcohol use Yes     Allergies   Patient has no known allergies.   Review of Systems Review of Systems  10 systems reviewed and found to be negative, except as noted in the HPI.   Physical Exam Updated Vital Signs BP 123/83   Pulse 60   Temp 98.6 F (37 C) (Oral)   Resp (!) 6   Ht 5\' 3"  (1.6 m)   Wt 52.2 kg   LMP 08/24/2016   SpO2 98%   Breastfeeding? Unknown   BMI 20.37 kg/m   Physical Exam  Constitutional: She is oriented to person, place, and time. She appears well-developed and well-nourished. No distress.  Alert  HENT:  Head: Normocephalic and atraumatic.  Mouth/Throat: Oropharynx is clear and moist.  Normal gag reflex  Eyes:  Conjunctivae and EOM are normal. Pupils are equal, round, and reactive to light.  Neck: Normal range of motion.  Cardiovascular: Normal rate, regular rhythm and intact distal pulses.   Pulmonary/Chest: Effort normal and breath sounds normal.  Abdominal: Soft. There is no tenderness.  Musculoskeletal: Normal range of motion.  Neurological: She is alert and oriented to person, place, and time.  Oriented 3  GCS 15  Skin: She is not diaphoretic.  Psychiatric: She has a normal mood and affect.  Nursing note and vitals reviewed.    ED Treatments / Results  Labs (all labs ordered are listed, but only abnormal results are displayed) Labs Reviewed  ETHANOL - Abnormal; Notable for the following:       Result Value   Alcohol, Ethyl (B) 6 (*)    All other components within normal limits  COMPREHENSIVE METABOLIC PANEL - Abnormal; Notable for the following:    ALT 10 (*)    Alkaline Phosphatase 37 (*)    All other components within normal limits  ACETAMINOPHEN LEVEL  SALICYLATE LEVEL  CBC WITH DIFFERENTIAL/PLATELET  PROTIME-INR  APTT  RAPID URINE DRUG SCREEN, HOSP PERFORMED  URINALYSIS, ROUTINE W REFLEX MICROSCOPIC  I-STAT BETA HCG BLOOD, ED ([facility], [facility], [facility] ONLY)    EKG  EKG Interpretation None       Radiology No results found.  Procedures Procedures (including critical care time)  CRITICAL CARE Performed by: Wynetta EmeryPISCIOTTA, Roselie Cirigliano   Total critical care time: 45 minutes  Critical care time was exclusive of separately billable procedures and treating other patients.  Critical care was necessary to treat or prevent imminent or life-threatening deterioration.  Critical care was time spent personally by me on the following activities: development of treatment plan with patient and/or surrogate as well as nursing, discussions with consultants, evaluation of patient's response to treatment, examination of patient, obtaining history from patient or surrogate, ordering and performing  treatments and interventions, ordering and review of laboratory studies, ordering and review of radiographic studies, pulse oximetry and re-evaluation of patient's condition.   Medications Ordered in ED Medications  acetylcysteine (ACETADOTE) 40 mg/mL load via infusion 7,830 mg (not administered)  naloxone (NARCAN) injection 0.4 mg (not administered)  sodium chloride 0.9 % bolus 1,000 mL (1,000 mLs Intravenous New Bag/Given 09/10/16 1355)     Initial Impression / Assessment and Plan / ED Course  I have reviewed the triage vital signs and the nursing notes.  Pertinent labs & imaging results that were available during my care of the patient were reviewed by me and considered in my medical decision making (see chart for details).     Vitals:   09/10/16 1500 09/10/16 1530 09/10/16 1600 09/10/16 1601  BP:  129/94 119/91 123/83   Pulse: 82 71 60   Resp: 21 19 (!) 6   Temp:      TempSrc:      SpO2: 100% 100% (!) 84% 98%  Weight:      Height:        Medications  acetylcysteine (ACETADOTE) 40 mg/mL load via infusion 7,830 mg (not administered)  naloxone (NARCAN) injection 0.4 mg (not administered)  sodium chloride 0.9 % bolus 1,000 mL (1,000 mLs Intravenous New Bag/Given 09/10/16 1355)    Cheyenne Hahn is 26 y.o. female presenting with Intentional overdose, undetermined, but likely suicide attempt. Significant amount of acetaminophen in the medications that she took. Also oxycodone, patient is tired with abdominal pain. No nausea or vomiting. EKG with normal intervals, no somnolence, tachypnea, tachycardia.  Patient is here voluntarily but if she wants to leave she will need to be involuntarily committed.  EKG with normal intervals, salicylate level negative, LFTs and INR normal.  APAP level 25 (drawn at 3PM) this is an unknown time of ingestion. Discussed with poison control and they did do recommend starting N-acetylcysteine. I don't feel this patient is appropriate for by mouth's at  this point so we'll start her with 150 mg/kg IV load, she will need admission and as per poison control we should recheck the acetaminophen level, LFTs and INR at 22 hours so if they are elevated the N-acetylcysteine can continue without interruption.  D.W Dr Gwenlyn Perking who accepts admission.   Formed by nurse that she became hypoxic while sleeping to 84%, she responded appropriately to 2 L via nasal cannula, Narcan is ordered when I went in to assess this patient she is alert, on her phone and talking, protecting her airway, last vital signs were registered as a respiration rate of 6 however, this is in accurate.  Final Clinical Impressions(s) / ED Diagnoses   Final diagnoses:  Acetaminophen overdose of undetermined intent, initial encounter    New Prescriptions New Prescriptions   No medications on file     Wynetta Emery, PA-C 09/10/16 1608    [facility], PA-C 09/10/16 [address] PA-C 09/10/16 1633    Rolland Porter, MD 09/20/16 [phone]

## 2016-09-10 NOTE — ED Notes (Signed)
PA notified about patients oxygen level.

## 2016-09-10 NOTE — ED Triage Notes (Signed)
Per EMS pt aprox 1 hour ago called her mother and said she needed help. Mother called EMS reporting pt took "a lot of medication." Pt admits to 2 oxycodone but not sure what else she took. On scene showed empty tylenol cold & flu box but no wrappers around. 3 empty Cetrizine HCL 10 mg tablets were inside tylenol box. Pt denies SI just states she was trying to get some sleep.

## 2016-09-10 NOTE — ED Notes (Signed)
Pt reports she took oxycodone, tylenol extra strength, sudafed, and certizine HCL but unsure what amount of each.   [employer] arrived with patient and reports mother is out in lobby and is trying to take IVC papers.

## 2016-09-10 NOTE — ED Notes (Signed)
Spoke with Almira CoasterGina from poison control. Recommend 4 hour tylenol level and monitor for at least 6 hours. Monitor for respiratory depression and tachycardia. Use benzo as needed. Fluid hydration.

## 2016-09-10 NOTE — H&P (Signed)
History and Physical    Cheyenne Hahn:[id number] DOB: 13-Aug-1990 DOA: 09/10/2016  Referring Provider: Dr. Freida Busman  PCP: No PCP Per Patient   Patient coming from: home  Chief Complaint: medication overdose  HPI: Cheyenne Hahn is a 26 y.o. female with PMH significant for with PMH significant for Marijuana abuse; who presented to ED via EMS secondary to medication overdose (using tylenol cold, cetirizine, Sudaphed and oxycodone). Patient called Mother around 11:00 am saying that she was not feeling good and 20 minutes later or so when the mother arrived found multiple empty Bottles and patient was complaining of abdominal pain. Patient in ED somnolent and couldn't remember how much medications she took or how she got to ED. Said she was having trouble sleeping and took all these medications to try to fall sleep. She denies intention to hurt herself, but admit to feeling depressed.   Patient denies CP, nausea, vomiting, HA's, dysuria, hematuria, hematemesis, melena, hematochezia, SOB, fever, chills or any other complaints.  Of note; abd pain started after ingestion of medications, localized in epigastric region, no radiation, 5/10 in intensity, nothing makes pain better or worse and denies associated symptoms.  ED Course: patient started on mucomyst and LFT's, INR and acetaminophen level done. [facility] called to admit patient for further evaluation and treatment. Given increase somnolence, one dose of Narcan given.  Review of Systems:  All other systems reviewed and apart from HPI, are negative.  Past Medical History:  Diagnosis Date  . Anemia   . No pertinent past medical history   . Pregnancy induced hypertension     Past Surgical History:  Procedure Laterality Date  . NO PAST SURGERIES    . WISDOM TOOTH EXTRACTION       reports that she quit smoking about 4 years ago. She has never used smokeless tobacco. She reports that she drinks alcohol. She reports that she does not use  drugs.  No Known Allergies  Family History  Problem Relation Age of Onset  . Hypertension Mother   . Hypertension Maternal Aunt   . Hypertension Maternal Uncle   . Hypertension Maternal Grandmother   . Diabetes Maternal Grandmother     Prior to Admission medications   Medication Sig Start Date End Date Taking? Authorizing Provider  acetaminophen (TYLENOL) 500 MG tablet Take 500 mg by mouth every 6 (six) hours as needed for mild pain, moderate pain, fever or headache.   Yes Historical Provider, MD  cetirizine (ZYRTEC) 10 MG tablet Take 30 mg by mouth once.   Yes Historical Provider, MD  oxyCODONE-acetaminophen (PERCOCET) 10-325 MG tablet Take 2 tablets by mouth once.   Yes Historical Provider, MD  phenylephrine (SUDAFED PE) 10 MG TABS tablet Take 40 mg by mouth every 4 (four) hours as needed (for cold).   Yes Historical Provider, MD  Phenylephrine-DM-GG-APAP (TYLENOL COLD/FLU SEVERE) 5-10-200-325 MG TABS Take 12 tablets by mouth once.   Yes Historical Provider, MD    Physical Exam: Vitals:   09/10/16 1600 09/10/16 1601 09/10/16 1630 09/10/16 1717  BP: 123/83  122/86 (!) 131/96  Pulse: 60  71 85  Resp: (!) 6  (!) 31 (!) 23  Temp:    98.2 F (36.8 C)  TempSrc:    Oral  SpO2: (!) 84% 98% 100% 99%  Weight:    50.3 kg (110 lb 14.3 oz)  Height:    5\' 3"  (1.6 m)    Constitutional: NAD, calm, comfortable and afebrile. Patient somnolent, but protecting airways and  answering questions appropriately  Eyes: PERTLA, lids and conjunctivae normal, no icterus ENMT: Mucous membranes slightly dry on exam. Posterior pharynx clear of any exudate or lesions. Normal dentition.  Neck: normal, supple, no masses, no thyromegaly, no JVD Respiratory: clear to auscultation bilaterally, no wheezing, no crackles. Normal respiratory effort. No accessory muscle use.  Cardiovascular: S1 & S2 heard, regular rate and rhythm, no murmurs / rubs / gallops. No extremity edema. 2+ pedal pulses. No carotid bruits.   Abdomen: No distension, mild discomfort with deep palpation in her epigastric area.o tenderness, no masses palpated. No hepatosplenomegaly. Bowel sounds normal.  Musculoskeletal: no clubbing / cyanosis. No joint deformity upper and lower extremities. Good ROM, no contractures. Normal muscle tone.  Skin: no rashes, lesions, ulcers. No induration. Tattoos appreciated on her left arm  Neurologic: CN 2-12 grossly intact. Sensation intact, DTR normal. Strength 5/5 in all 4 limbs.  Psychiatric: Normal judgment and insight. Alert and oriented x 3. Flat affect   Labs on Admission: I have personally reviewed following labs and imaging studies  CBC:  Recent Labs Lab 09/10/16 1344  WBC 7.0  NEUTROABS 4.6  HGB 12.9  HCT 38.4  MCV 89.1  PLT 261   Basic Metabolic Panel:  Recent Labs Lab 09/10/16 1344  NA 142  K 3.6  CL 107  CO2 27  GLUCOSE 79  BUN 7  CREATININE 0.74  CALCIUM 9.5   GFR: Estimated Creatinine Clearance: 84.6 mL/min (by C-G formula based on SCr of 0.74 mg/dL).   Liver Function Tests:  Recent Labs Lab 09/10/16 1344  AST 18  ALT 10*  ALKPHOS 37*  BILITOT 0.7  PROT 7.6  ALBUMIN 4.2   Coagulation Profile:  Recent Labs Lab 09/10/16 1344  INR 1.09   Urine analysis:    Component Value Date/Time   COLORURINE AMBER (A) 06/02/2016 1330   APPEARANCEUR CLEAR 06/02/2016 1330   LABSPEC 1.036 (H) 06/02/2016 1330   PHURINE 7.0 06/02/2016 1330   GLUCOSEU NEGATIVE 06/02/2016 1330   HGBUR LARGE (A) 06/02/2016 1330   BILIRUBINUR NEGATIVE 06/02/2016 1330   BILIRUBINUR Negative 02/07/2014 1042   KETONESUR NEGATIVE 06/02/2016 1330   PROTEINUR 30 (A) 06/02/2016 1330   UROBILINOGEN negative 02/07/2014 1042   UROBILINOGEN 1.0 10/30/2013 2000   NITRITE NEGATIVE 06/02/2016 1330   LEUKOCYTESUR NEGATIVE 06/02/2016 1330   Radiological Exams on Admission: No results found.  EKG:  Sinus rhythm, regular rate, normal axis. No acute ischemic  changes  Assessment/Plan 1-Medication overdose: patient took oxycodone, tylenol cold, cetirizine and sudaphed; unkown amount. -acetaminophen level 25 on admission and normal LFT's -patient is somnolent, even appropriate and protecting airways -will bring in observation to stepdown and following rec's from poison control given overdose, will start acetylcysteine protocol (per pharmacy) -IVF's and supportive care  2-Depression: -patient with approx 3 months miscarriage  -probably grief vs underlying treated mood disorder -is denying SI currently, but in agreement/interested to see psychiatry -sitter at bedside for now  3-Abdominal pain -probably from GERD vs amount of overdose meds -will use some protonix -continue PRN antiemetics  4-Marijuana use -cessation counseling provided  Time: 55 minutes   DVT prophylaxis: heparin   Code Status: Full Family Communication: no family at bedside   Disposition Plan: anticipate discharge back home; most likely in less than 2 midnights  Consults called: psychiatry   Admission status: observation, stepdown (due to degree of somnolence and concerns for resp wellbeing); LOS most likely < 2 midnights.    Vassie Loll MD [facility] Pager  [phone]  If 7PM-7AM, please contact night-coverage [email or web address] Password [email or web address]  09/10/2016, 5:21 PM

## 2016-09-10 NOTE — Progress Notes (Signed)
Pharmacy: Re- Acetadote  Patient's a 26 y.o F presented to the ED on 09/10/16 for suspected intentional overdose of tylenol, sudafed and certizine.  ED staff contacted poison control with Acedatote loading dose ordered for patient.  Of note, I [facility] [place] [facility] and she stated that patient should get both acetadote loading dose and maintenance infusion -- then check INR, tylenol level, and LFTs 22 hours after initiation of acetadote drip. If levels are elevated, then continue with Acetadote drip.  Maintenance drip and labs ordered --Dr. Gwenlyn PerkingMadera informed and is in agreement with plan for patient.  Dorna LeitzAnh Thai Hemrick, PharmD, BCPS 09/10/2016 5:13 PM

## 2016-09-11 DIAGNOSIS — F1210 Cannabis abuse, uncomplicated: Secondary | ICD-10-CM

## 2016-09-11 DIAGNOSIS — Z8249 Family history of ischemic heart disease and other diseases of the circulatory system: Secondary | ICD-10-CM

## 2016-09-11 DIAGNOSIS — K219 Gastro-esophageal reflux disease without esophagitis: Secondary | ICD-10-CM

## 2016-09-11 DIAGNOSIS — Z87891 Personal history of nicotine dependence: Secondary | ICD-10-CM

## 2016-09-11 DIAGNOSIS — Z833 Family history of diabetes mellitus: Secondary | ICD-10-CM

## 2016-09-11 DIAGNOSIS — R109 Unspecified abdominal pain: Secondary | ICD-10-CM

## 2016-09-11 DIAGNOSIS — Z79899 Other long term (current) drug therapy: Secondary | ICD-10-CM

## 2016-09-11 LAB — HEPATIC FUNCTION PANEL
ALT: 7 U/L — AB (ref 14–54)
AST: 14 U/L — ABNORMAL LOW (ref 15–41)
Albumin: 3.4 g/dL — ABNORMAL LOW (ref 3.5–5.0)
Alkaline Phosphatase: 32 U/L — ABNORMAL LOW (ref 38–126)
Bilirubin, Direct: <0.1 mg/dL — ABNORMAL LOW (ref 0.1–0.5)
TOTAL PROTEIN: 6.6 g/dL (ref 6.5–8.1)
Total Bilirubin: 0.6 mg/dL (ref 0.3–1.2)

## 2016-09-11 LAB — PROTIME-INR
INR: 1.21
PROTHROMBIN TIME: 15.3 s — AB (ref 11.4–15.2)

## 2016-09-11 LAB — ACETAMINOPHEN LEVEL

## 2016-09-11 MED ORDER — ADULT MULTIVITAMIN W/MINERALS CH: 1.0000 | ORAL_TABLET | Freq: Every day | ORAL | Status: DC

## 2016-09-11 MED ORDER — MAGNESIUM SULFATE 2 GM/50ML IV SOLN
2.0000 g | Freq: Once | INTRAVENOUS | Status: AC
  Administered 2016-09-11: 2 g via INTRAVENOUS
  Filled 2016-09-11: qty 50

## 2016-09-11 MED ORDER — PANTOPRAZOLE SODIUM 40 MG PO TBEC: 40.0000 mg | DELAYED_RELEASE_TABLET | Freq: Every day | ORAL | 1 refills | Status: DC

## 2016-09-11 NOTE — Clinical Social Work Psych Note (Addendum)
Clinical Social Worker Psych Service Line Progress Note  Clinical Social Worker: Lia Hopping, LCSW Date/Time: 09/11/2016, 2:19 PM   Review of Patient  Overall Medical Condition:  ICU Step-down  Participation Level:  Active Participation Quality: Attentive Other Participation Quality:  Calm and Pleasant   Affect: Appropriate Cognitive: Alert, Oriented Reaction to Medications/Concerns:  Patient reports she took 5 tylenol PM  medications to fall asleep.   Modes of Intervention: Exploration, Support   Summary of Progress/Plan at Discharge  Summary of Progress/Plan at [place] and psychiatrist met with patient at bedside, explained role and reason for intervention. Patient agreeable to talk. Patient reports she has been having difficulty sleeping for about two years, and prior admission she took 5 tylenol to sleep. Patient denies intentional overdose on medication. Patient does not have hx of mental health or substance use. Patient reports she does get depressed at times. She expresses " I am not weak minded, I can handle things."Patient reports she lives with her 2 small children. Patient reports she gets supports from her children's father, her own father and friends. 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   Patient has requested PCP and assistance with insurance and is hopeful to get medication that will help her sleep. RN informed to consult RNCM.   Patient has been cleared to go home per Psychiatrist. LCSWA provided patient with outpatient psychiatry resources.  [facility] system is presenting in chart- patient has [employer]. Patient was unaware of this. [place] educated patient about looking on back of medicaid card and calling service number to get information for PCP covered by medicaid.  Patient reports understanding and was thankful for CSW intervention.   No other other social service needs identified by patient at this time. Please  reconsult CSW if social service needs arise.

## 2016-09-11 NOTE — Discharge Planning (Signed)
Discharge instructions provided to patient via charge RN, Ivs, removed and this RN walked patient down to her family.

## 2016-09-11 NOTE — Discharge Summary (Signed)
Physician Discharge Summary  Cheyenne Hahn WUJ:[id number] DOB: January 06, 1991 DOA: 09/10/2016  PCP: No PCP Per Patient  Admit date: 09/10/2016 Discharge date: 09/11/2016  Time spent: 35 minutes  Recommendations for Outpatient Follow-up:  1. Repeat CMET to follow electrolytes, renal function and electrolytes  Discharge Diagnoses:  Principal Problem:   Medication overdose Active Problems:   Depression   Abdominal pain   Acetaminophen overdose of undetermined intent   Gastroesophageal reflux disease   Discharge Condition: stable and improved for discharge. Patient clear by psychiatry and w/o acute SI or hallucinations.  Diet recommendation: regular   Filed Weights   09/10/16 1330 09/10/16 1717  Weight: 52.2 kg (115 lb) 50.3 kg (110 lb 14.3 oz)    History of present illness:  25 y.o. female with PMH significant for with PMH significant for Marijuana abuse; who presented to ED via EMS secondary to medication overdose (using tylenol cold, cetirizine, Sudaphed and oxycodone). Patient called Mother around 11:00 am saying that she was not feeling good and 20 minutes later or so when the mother arrived found multiple empty Bottles and patient was complaining of abdominal pain. Patient in ED somnolent and couldn't remember how much medications she took or how she got to ED. Said she was having trouble sleeping and took all these medications to try to fall sleep. She denies intention to hurt herself, but admit to feeling depressed.   Patient denies CP, nausea, vomiting, HA's, dysuria, hematuria, hematemesis, melena, hematochezia, SOB, fever, chills or any other complaints.  Of note; abd pain started after ingestion of medications, localized in epigastric region, no radiation, 5/10 in intensity, nothing makes pain better or worse and denies associated symptoms.  Hospital Course:  1-Medication overdose: patient took oxycodone, tylenol cold, cetirizine and sudaphed; unkown amount. -acetaminophen  level 25 on admission and normal LFT's -patient was somnolent, even appropriate and protecting airways -patient treated with mucomyst as per poison control recommendations -24 hours after treatment, tylenol level, LFT's and INR unremarkable -no further treatment/observation recommended and patient stable for discharge  2-Depression: -patient with approx 3 months miscarriage  -probably grief vs underlying untreated mood disorder -patient is denying SI or hallucinations currently -seen by psychiatry and clear for discharge  3-Abdominal pain: intermittent and per patient burning in nature  -probably from GERD  -will use protonix  4-Marijuana use -cessation counseling provided  Procedures:  None   Consultations:  Poison control   Discharge Exam: Vitals:   09/11/16 1400 09/11/16 1600  BP: 130/89 123/81  Pulse: 82 84  Resp: (!) 21 14  Temp:      General: afebrile, feeling good and in no major distress. Patient denies nausea or vomiting. Also denies SI and/or hallucinations. Patient reports some intermittent abd cramps. Cardiovascular: S1 and S2, no rubs, no gallops Respiratory: CTA bilaterally  Abd: soft, No guarding, ND, positive BS Extremities: no edema, no cyanosis   Discharge Instructions   Discharge Instructions    Discharge instructions    Complete by:  As directed    Keep yourself well hydrated  Follow up with PCP in 10 days Please avoid the use of any over the counter medication w/o prior instructions from your doctor     Current Discharge Medication List    START taking these medications   Details  Multiple Vitamin (MULTIVITAMIN WITH MINERALS) TABS tablet Take 1 tablet by mouth daily.    pantoprazole (PROTONIX) 40 MG tablet Take 1 tablet (40 mg total) by mouth daily. Qty: 30 tablet, Refills: 1  STOP taking these medications     acetaminophen (TYLENOL) 500 MG tablet      cetirizine (ZYRTEC) 10 MG tablet      oxyCODONE-acetaminophen  (PERCOCET) 10-325 MG tablet      phenylephrine (SUDAFED PE) 10 MG TABS tablet      Phenylephrine-DM-GG-APAP (TYLENOL COLD/FLU SEVERE) 5-10-200-325 MG TABS        No Known Allergies   The results of significant diagnostics from this hospitalization (including imaging, microbiology, ancillary and laboratory) are listed below for reference.    Significant Diagnostic Studies: No results found.  Microbiology: Recent Results (from the past 240 hour(s))  MRSA PCR Screening     Status: None   Collection Time: 09/10/16  5:20 PM  Result Value Ref Range Status   MRSA by PCR NEGATIVE NEGATIVE Final    Comment:        The GeneXpert MRSA Assay (FDA approved for NASAL specimens only), is one component of a comprehensive MRSA colonization surveillance program. It is not intended to diagnose MRSA infection nor to guide or monitor treatment for MRSA infections.      Labs: Basic Metabolic Panel:  Recent Labs Lab 09/10/16 1344 09/10/16 1930  NA 142  --   K 3.6  --   CL 107  --   CO2 27  --   GLUCOSE 79  --   BUN 7  --   CREATININE 0.74 0.65  CALCIUM 9.5  --   MG  --  1.6*  PHOS  --  2.6   Liver Function Tests:  Recent Labs Lab 09/10/16 1344 09/11/16 1452  AST 18 14*  ALT 10* 7*  ALKPHOS 37* 32*  BILITOT 0.7 0.6  PROT 7.6 6.6  ALBUMIN 4.2 3.4*   CBC:  Recent Labs Lab 09/10/16 1344 09/10/16 1930  WBC 7.0 5.3  NEUTROABS 4.6  --   HGB 12.9 11.0*  HCT 38.4 32.5*  MCV 89.1 89.3  PLT 261 205    Signed:  Vassie LollMadera, Yaa Donnellan MD.  [facility] 09/11/2016, 5:11 PM

## 2016-09-11 NOTE — Consult Note (Signed)
[facility] Consult   Reason for Consult:  overdose Referring Physician:  Dr. Dyann Kief Patient Identification: Cheyenne Hahn MRN:  [id number] Principal Diagnosis: Medication overdose Diagnosis:   Patient Active Problem List   Diagnosis Date Noted  . Gastroesophageal reflux disease [K21.9]   . Medication overdose [T50.901A] 09/10/2016  . Depression [F32.9] 09/10/2016  . Abdominal pain [R10.9] 09/10/2016  . Acetaminophen overdose of undetermined intent [T39.1X4A]   . Indication for care in labor or delivery [O75.9] 02/08/2014  . Normal delivery [O80, Z37.9] 02/08/2014  . Normal pregnancy [V22] 03/11/2011  . Decreased fetal movement [O36.8190] 03/11/2011    Total Time spent with patient: 1 hour  Subjective:   Cheyenne Hahn is a 26 y.o. female patient admitted with Intentional drug overdose.  HPI:  Cheyenne Hahn is a 26 y.o. female, seen, chart reviewed and case discussed with psych at [facility] who is rounding with this Architect. Patient appeared lying in her bed, calm and cooperative and she has 2 best friends visiting her in the hospital. Patient reported she has taken 2 pills of the oxycodone and 5 pills of extra strength Tylenol because she was not able to sleep and at the same time she denied symptoms of depression, anxiety, psychosis and also suicidal/homicidal ideation, intention or plans. Patient stated after taking the pills she still could not sleep so she contacted her mother who, called emergency medical service. Patient reportedly graduated from [employer] and currently studying online [employer] and has 2 children who are 40 and 54 years old. Patient stated that she likes herself and her children. Patient stated father's of the children and her own father from [place] can provide financial support as needed. Patient also has a good terms with her mother. A urine drug screen is positive for tetrahydrocannabinol. Patient contract for safety.  Past  Psychiatric History: Patient has no previous history of outpatient or inpatient psychiatric treatment.    Risk to Self: Is patient at risk for suicide?: No Risk to Others:   Prior Inpatient Therapy:   Prior Outpatient Therapy:    Past Medical History:  Past Medical History:  Diagnosis Date  . Anemia   . No pertinent past medical history   . Pregnancy induced hypertension     Past Surgical History:  Procedure Laterality Date  . NO PAST SURGERIES    . WISDOM TOOTH EXTRACTION     Family History:  Family History  Problem Relation Age of Onset  . Hypertension Mother   . Hypertension Maternal Aunt   . Hypertension Maternal Uncle   . Hypertension Maternal Grandmother   . Diabetes Maternal Grandmother    Family Psychiatric  History: Patient reported her grandmother may have some symptoms of depression.  Social History:  History  Alcohol Use  . Yes     History  Drug Use No    Social History   Social History  . Marital status: Single    Spouse name: N/A  . Number of children: N/A  . Years of education: N/A   Social History Main Topics  . Smoking status: Former Smoker    Quit date: 05/02/2012  . Smokeless tobacco: Never Used     Comment: 2011  . Alcohol use Yes  . Drug use: No  . Sexual activity: Yes    Birth control/ protection: None   Other Topics Concern  . None   Social History Narrative  . None   Additional Social History:    Allergies:  No Known Allergies  Labs:  Results for orders placed or performed during the hospital encounter of 09/10/16 (from the past 48 hour(s))  Urinalysis, Routine w reflex microscopic     Status: Abnormal   Collection Time: 09/10/16  1:40 PM  Result Value Ref Range   Color, Urine STRAW (A) YELLOW   APPearance CLEAR CLEAR   Specific Gravity, Urine 1.008 1.005 - 1.030   pH 6.0 5.0 - 8.0   Glucose, UA NEGATIVE NEGATIVE mg/dL   Hgb urine dipstick SMALL (A) NEGATIVE   Bilirubin Urine NEGATIVE NEGATIVE   Ketones, ur  NEGATIVE NEGATIVE mg/dL   Protein, ur NEGATIVE NEGATIVE mg/dL   Nitrite NEGATIVE NEGATIVE   Leukocytes, UA NEGATIVE NEGATIVE   RBC / HPF 0-5 0 - 5 RBC/hpf   WBC, UA 0-5 0 - 5 WBC/hpf   Bacteria, UA NONE SEEN NONE SEEN   Squamous Epithelial / LPF 0-5 (A) NONE SEEN   Mucous PRESENT   Acetaminophen level     Status: None   Collection Time: 09/10/16  1:43 PM  Result Value Ref Range   Acetaminophen (Tylenol), Serum 25 10 - 30 ug/mL    Comment:        THERAPEUTIC CONCENTRATIONS VARY SIGNIFICANTLY. A RANGE OF 10-30 ug/mL MAY BE AN EFFECTIVE CONCENTRATION FOR MANY PATIENTS. HOWEVER, SOME ARE BEST TREATED AT CONCENTRATIONS OUTSIDE THIS RANGE. ACETAMINOPHEN CONCENTRATIONS >150 ug/mL AT 4 HOURS AFTER INGESTION AND >50 ug/mL AT 12 HOURS AFTER INGESTION ARE OFTEN ASSOCIATED WITH TOXIC REACTIONS.   Salicylate level     Status: None   Collection Time: 09/10/16  1:43 PM  Result Value Ref Range   Salicylate Lvl <7.9 2.8 - 30.0 mg/dL  Ethanol     Status: Abnormal   Collection Time: 09/10/16  1:43 PM  Result Value Ref Range   Alcohol, Ethyl (B) 6 (H) <5 mg/dL    Comment:        LOWEST DETECTABLE LIMIT FOR SERUM ALCOHOL IS 5 mg/dL FOR MEDICAL PURPOSES ONLY   Rapid urine drug screen (hospital performed)     Status: Abnormal   Collection Time: 09/10/16  1:43 PM  Result Value Ref Range   Opiates NONE DETECTED NONE DETECTED   Cocaine NONE DETECTED NONE DETECTED   Benzodiazepines NONE DETECTED NONE DETECTED   Amphetamines NONE DETECTED NONE DETECTED   Tetrahydrocannabinol POSITIVE (A) NONE DETECTED   Barbiturates NONE DETECTED NONE DETECTED  CBC with Differential     Status: None   Collection Time: 09/10/16  1:44 PM  Result Value Ref Range   WBC 7.0 4.0 - 10.5 K/uL   RBC 4.31 3.87 - 5.11 MIL/uL   Hemoglobin 12.9 12.0 - 15.0 g/dL   HCT 38.4 36.0 - 46.0 %   MCV 89.1 78.0 - 100.0 fL   MCH 29.9 26.0 - 34.0 pg   MCHC 33.6 30.0 - 36.0 g/dL   RDW 13.2 11.5 - 15.5 %   Platelets 261 150 -  400 K/uL   Neutrophils Relative % 66 %   Neutro Abs 4.6 1.7 - 7.7 K/uL   Lymphocytes Relative 28 %   Lymphs Abs 1.9 0.7 - 4.0 K/uL   Monocytes Relative 6 %   Monocytes Absolute 0.4 0.1 - 1.0 K/uL   Eosinophils Relative 0 %   Eosinophils Absolute 0.0 0.0 - 0.7 K/uL   Basophils Relative 0 %   Basophils Absolute 0.0 0.0 - 0.1 K/uL  Comprehensive metabolic panel     Status: Abnormal   Collection Time: 09/10/16  1:44 PM  Result Value Ref Range   Sodium 142 135 - 145 mmol/L   Potassium 3.6 3.5 - 5.1 mmol/L   Chloride 107 101 - 111 mmol/L   CO2 27 22 - 32 mmol/L   Glucose, Bld 79 65 - 99 mg/dL   BUN 7 6 - 20 mg/dL   Creatinine, Ser 0.74 0.44 - 1.00 mg/dL   Calcium 9.5 8.9 - 10.3 mg/dL   Total Protein 7.6 6.5 - 8.1 g/dL   Albumin 4.2 3.5 - 5.0 g/dL   AST 18 15 - 41 U/L   ALT 10 (L) 14 - 54 U/L   Alkaline Phosphatase 37 (L) 38 - 126 U/L   Total Bilirubin 0.7 0.3 - 1.2 mg/dL   GFR calc non Af Amer >60 >60 mL/min   GFR calc Af Amer >60 >60 mL/min    Comment: (NOTE) The eGFR has been calculated using the CKD EPI equation. This calculation has not been validated in all clinical situations. eGFR's persistently <60 mL/min signify possible Chronic Kidney Disease.    Anion gap 8 5 - 15  Protime-INR     Status: None   Collection Time: 09/10/16  1:44 PM  Result Value Ref Range   Prothrombin Time 14.2 11.4 - 15.2 seconds   INR 1.09   I-Stat Beta hCG blood, ED ([facility], [facility], [facility] only)     Status: None   Collection Time: 09/10/16  2:07 PM  Result Value Ref Range   I-stat hCG, quantitative <5.0 <5 mIU/mL   Comment 3            Comment:   GEST. AGE      CONC.  (mIU/mL)   <=1 WEEK        5 - 50     2 WEEKS       50 - 500     3 WEEKS       100 - 10,000     4 WEEKS     1,000 - 30,000        FEMALE AND NON-PREGNANT FEMALE:     LESS THAN 5 mIU/mL   APTT     Status: None   Collection Time: 09/10/16  2:24 PM  Result Value Ref Range   aPTT 30 24 - 36 seconds  MRSA PCR Screening     Status: None    Collection Time: 09/10/16  5:20 PM  Result Value Ref Range   MRSA by PCR NEGATIVE NEGATIVE    Comment:        The GeneXpert MRSA Assay (FDA approved for NASAL specimens only), is one component of a comprehensive MRSA colonization surveillance program. It is not intended to diagnose MRSA infection nor to guide or monitor treatment for MRSA infections.   CBC     Status: Abnormal   Collection Time: 09/10/16  7:30 PM  Result Value Ref Range   WBC 5.3 4.0 - 10.5 K/uL   RBC 3.64 (L) 3.87 - 5.11 MIL/uL   Hemoglobin 11.0 (L) 12.0 - 15.0 g/dL   HCT 32.5 (L) 36.0 - 46.0 %   MCV 89.3 78.0 - 100.0 fL   MCH 30.2 26.0 - 34.0 pg   MCHC 33.8 30.0 - 36.0 g/dL   RDW 13.3 11.5 - 15.5 %   Platelets 205 150 - 400 K/uL  Creatinine, serum     Status: None   Collection Time: 09/10/16  7:30 PM  Result Value Ref Range   Creatinine, Ser 0.65 0.44 - 1.00  mg/dL   GFR calc non Af Amer >60 >60 mL/min   GFR calc Af Amer >60 >60 mL/min    Comment: (NOTE) The eGFR has been calculated using the CKD EPI equation. This calculation has not been validated in all clinical situations. eGFR's persistently <60 mL/min signify possible Chronic Kidney Disease.   Magnesium     Status: Abnormal   Collection Time: 09/10/16  7:30 PM  Result Value Ref Range   Magnesium 1.6 (L) 1.7 - 2.4 mg/dL  Phosphorus     Status: None   Collection Time: 09/10/16  7:30 PM  Result Value Ref Range   Phosphorus 2.6 2.5 - 4.6 mg/dL  TSH     Status: None   Collection Time: 09/10/16  7:30 PM  Result Value Ref Range   TSH 1.423 0.350 - 4.500 uIU/mL    Comment: Performed by a 3rd Generation assay with a functional sensitivity of <=0.01 uIU/mL.  Acetaminophen level     Status: Abnormal   Collection Time: 09/11/16  2:52 PM  Result Value Ref Range   Acetaminophen (Tylenol), Serum <10 (L) 10 - 30 ug/mL    Comment:        THERAPEUTIC CONCENTRATIONS VARY SIGNIFICANTLY. A RANGE OF 10-30 ug/mL MAY BE AN EFFECTIVE CONCENTRATION FOR MANY  PATIENTS. HOWEVER, SOME ARE BEST TREATED AT CONCENTRATIONS OUTSIDE THIS RANGE. ACETAMINOPHEN CONCENTRATIONS >150 ug/mL AT 4 HOURS AFTER INGESTION AND >50 ug/mL AT 12 HOURS AFTER INGESTION ARE OFTEN ASSOCIATED WITH TOXIC REACTIONS.   Hepatic function panel     Status: Abnormal   Collection Time: 09/11/16  2:52 PM  Result Value Ref Range   Total Protein 6.6 6.5 - 8.1 g/dL   Albumin 3.4 (L) 3.5 - 5.0 g/dL   AST 14 (L) 15 - 41 U/L   ALT 7 (L) 14 - 54 U/L   Alkaline Phosphatase 32 (L) 38 - 126 U/L   Total Bilirubin 0.6 0.3 - 1.2 mg/dL   Bilirubin, Direct <8.9 (L) 0.1 - 0.5 mg/dL   Indirect Bilirubin NOT CALCULATED 0.3 - 0.9 mg/dL  Protime-INR     Status: Abnormal   Collection Time: 09/11/16  2:52 PM  Result Value Ref Range   Prothrombin Time 15.3 (H) 11.4 - 15.2 seconds   INR 1.21     Current Facility-Administered Medications  Medication Dose Route Frequency Provider Last Rate Last Dose  . 0.9 %  sodium chloride infusion   Intravenous Continuous Vassie Loll, MD 75 mL/hr at 09/11/16 1500    . heparin injection 5,000 Units  5,000 Units Subcutaneous Q8H Vassie Loll, MD   5,000 Units at 09/11/16 [phone]  . multivitamin with minerals tablet 1 tablet  1 tablet Oral Daily Vassie Loll, MD   1 tablet at 09/11/16 0955  . ondansetron (ZOFRAN) tablet 4 mg  4 mg Oral Q6H PRN Vassie Loll, MD       Or  . ondansetron [facility]) injection 4 mg  4 mg Intravenous Q6H PRN Vassie Loll, MD      . pantoprazole (PROTONIX) EC tablet 40 mg  40 mg Oral Daily Vassie Loll, MD   40 mg at 09/11/16 5149    Musculoskeletal: Strength & Muscle Tone: within normal limits Gait & Station: normal Patient leans: N/A  Psychiatric Specialty Exam: Physical Exam as per history and physical   ROS patient complaining about insomnia and smoking tetrahydrocannabinol. Patient denied nausea, vomiting, abdomen pain, shortness of breath and chest pain.  No Fever-chills, No Headache, No changes with Vision or hearing,  reports vertigo No problems swallowing food or Liquids, No Chest pain, Cough or Shortness of Breath, No Abdominal pain, No Nausea or Vommitting, Bowel movements are regular, No Blood in stool or Urine, No dysuria, No new skin rashes or bruises, No new joints pains-aches,  No new weakness, tingling, numbness in any extremity, No recent weight gain or loss, No polyuria, polydypsia or polyphagia,   A full 10 point Review of Systems was done, except as stated above, all other Review of Systems were negative.  Blood pressure 123/81, pulse 84, temperature 98.2 F (36.8 C), resp. rate 14, height _0  (1.6 m), weight 50.3 kg (110 lb 14.3 oz), last menstrual period 08/24/2016, SpO2 100 %, unknown if currently breastfeeding.Body mass index is 19.64 kg/m.  Psychiatric Specialty Exam: Physical Exam as per history and physical   ROS denied symptoms of depression, anxiety and psychosis. Patient is willing to follow with current medications and outpatient ligation management. Patient has chest pain on arrival which has been controlled with medication treatment. No Fever-chills, No Headache, No changes with Vision or hearing, reports vertigo No problems swallowing food or Liquids, No Chest pain, Cough or Shortness of Breath, No Abdominal pain, No Nausea or Vommitting, Bowel movements are regular, No Blood in stool or Urine, No dysuria, No new skin rashes or bruises, No new joints pains-aches,  No new weakness, tingling, numbness in any extremity, No recent weight gain or loss, No polyuria, polydypsia or polyphagia,   A full 10 point Review of Systems was done, except as stated above, all other Review of Systems were negative.  Blood pressure (!) 142/74, pulse 78, temperature 98.3 F (36.8 C), temperature source Oral, resp. rate 16, height _1  (1.626 m), weight 106.9 kg (235 lb 9.6 oz), SpO2 96 %.Body mass index is 40.44 kg/m.  General Appearance: Casual  Eye Contact:  Good  Speech:   Clear and Coherent  Volume:  Normal  Mood:  Depressed  Affect:  Appropriate and Congruent  Thought Process:  Coherent and Goal Directed  Orientation:  Full (Time, Place, and Person)  Thought Content:  WDL  Suicidal Thoughts:  No  Homicidal Thoughts:  No  Memory:  Immediate;   Good Recent;   Fair Remote;   Fair  Judgement:  Intact  Insight:  Good  Psychomotor Activity:  Decreased  Concentration:  Concentration: Good and Attention Span: Good  Recall:  Good  Fund of Knowledge:  Good  Language:  Good  Akathisia:  Yes  Handed:  Right  AIMS (if indicated):     Assets:  Communication Skills Desire for Improvement Financial Resources/Insurance Housing Leisure Time Resilience Social Support Transportation  ADL's:  Intact  Cognition:  WNL  Sleep:            Treatment Plan Summary: 26 years old female, mother of 2 children admitted to the [facility] status post intentional overdose of extra strength Tylenol and also oxycodone with intention to falling to sleep. Patient also has cannabis abuse.  Patient contract for safety so discontinue safety sitter Recommended no medication management at this time Referred to the case manager who can provide the outpatient resources as patient has no health insurance Daily contact with patient to assess and evaluate symptoms and progress in treatment and Medication management  Disposition: Patient is psychiatrically cleared for outpatient treatment. No evidence of imminent risk to self or others at present.   Supportive therapy provided about ongoing stressors.  Ambrose Finland, MD 09/11/2016 5:59 PM

## 2016-09-11 NOTE — Progress Notes (Signed)
Date:  September 11, 2016 Chart reviewed for concurrent status and case management needs. Will continue to follow patient progress. Discharge Planning: following for needs Expected discharge date: [id number] Rhonda Davis, BSN, RN3, CCM   [phone] 

## 2016-09-11 NOTE — Progress Notes (Signed)
Pharmacy - IV Acetadote  Assessment/Plan 25 yoF on IV acetadote for APAP overdose.   Repeat labs wnl  Spoke with poison control; OK to stop acetadote as long as patient is asymptomatic  Spoke with MD and will discontinue acetadote  Bernadene Personrew Braidon Chermak, PharmD, BCPS Pager: [phone] 09/11/2016, 4:44 PM

## 2016-09-11 NOTE — Progress Notes (Signed)
Poison Control representative given updates on pt's condition. No further recommendations given at this time.

## 2016-10-25 ENCOUNTER — Emergency Department (HOSPITAL_COMMUNITY)
Admission: EM | Admit: 2016-10-25 | Discharge: 2016-10-25 | Disposition: A | Discharge status: Home / Self Care | Payer: Self-pay | Attending: Emergency Medicine | Admitting: Emergency Medicine

## 2016-10-25 ENCOUNTER — Emergency Department (HOSPITAL_COMMUNITY): Payer: Self-pay

## 2016-10-25 ENCOUNTER — Encounter (HOSPITAL_COMMUNITY): Payer: Self-pay

## 2016-10-25 DIAGNOSIS — K30 Functional dyspepsia: Secondary | ICD-10-CM | POA: Insufficient documentation

## 2016-10-25 DIAGNOSIS — Z87891 Personal history of nicotine dependence: Secondary | ICD-10-CM | POA: Insufficient documentation

## 2016-10-25 LAB — CBC
HCT: 40.7 % (ref 36.0–46.0)
Hemoglobin: 13.5 g/dL (ref 12.0–15.0)
MCH: 29.7 pg (ref 26.0–34.0)
MCHC: 33.2 g/dL (ref 30.0–36.0)
MCV: 89.6 fL (ref 78.0–100.0)
Platelets: 272 10*3/uL (ref 150–400)
RBC: 4.54 MIL/uL (ref 3.87–5.11)
RDW: 13 % (ref 11.5–15.5)
WBC: 4.8 10*3/uL (ref 4.0–10.5)

## 2016-10-25 LAB — BASIC METABOLIC PANEL
Anion gap: 5 (ref 5–15)
BUN: 11 mg/dL (ref 6–20)
CALCIUM: 9.2 mg/dL (ref 8.9–10.3)
CHLORIDE: 105 mmol/L (ref 101–111)
CO2: 26 mmol/L (ref 22–32)
CREATININE: 0.75 mg/dL (ref 0.44–1.00)
GFR calc Af Amer: >60 mL/min (ref >60)
GFR calc non Af Amer: >60 mL/min (ref >60)
Glucose, Bld: 92 mg/dL (ref 65–99)
Potassium: 3.8 mmol/L (ref 3.5–5.1)
SODIUM: 136 mmol/L (ref 135–145)

## 2016-10-25 LAB — I-STAT TROPONIN, ED: TROPONIN I, POC: 0 ng/mL (ref 0.00–0.08)

## 2016-10-25 MED ORDER — OMEPRAZOLE 20 MG PO CPDR: 20.0000 mg | DELAYED_RELEASE_CAPSULE | Freq: Every day | ORAL | 0 refills | Status: DC

## 2016-10-25 MED ORDER — GI COCKTAIL ~~LOC~~
30.0000 mL | Freq: Once | ORAL | Status: AC
  Administered 2016-10-25: 30 mL via ORAL
  Filled 2016-10-25: qty 30

## 2016-10-25 NOTE — Discharge Instructions (Signed)
Please read attached information. If you experience any new or worsening signs or symptoms please return to the emergency room for evaluation. Please follow-up with your primary care provider or specialist as discussed. Please use medication prescribed only as directed and discontinue taking if you have any concerning signs or symptoms.   °

## 2016-10-25 NOTE — ED Triage Notes (Signed)
Pt here with chest pain starting last night.  Dizziness upon standing.  No cough, congestion or fever.  No n/v/d.  No increased activity.  Some shortness of breath.

## 2016-10-25 NOTE — ED Provider Notes (Signed)
[facility]-EMERGENCY DEPT Provider Note   CSN: [id number] Arrival date & time: 10/25/16  1150    History   Chief Complaint Chief Complaint  Patient presents with  . Chest Pain  . Dizziness    HPI Cheyenne Hahn is a 26 y.o. female.  HPI  26 year old female presents today with complaints of chest pain.  Patient notes last night while watching TV she felt a tightness in her substernal chest region.  She denies any associated symptoms.  She notes this felt like indigestion she has had in the past, took her ranitidine which did not improve her symptoms.  Patient reports after waking up this morning the pain was more severe and described as "my chest was on fire".  Patient then proceeded the emergency room for evaluation.  Patient notes one episode of dizziness upon standing, that has not happened since.  Patient denies any diaphoresis, vomiting, lower extremity swelling or edema, abdominal pain, fever or any upper respiratory complaints.  Patient denies any history of cardiac problems in the past, denies any significant family history.  No history DVT, PE, or any significant risk factors.  Patient reports that she does not smoke, does not use alcohol, has not had any over-the-counter pain medications in over a month.  Patient notes symptoms are slightly worse at the end of the deep inspiration, but notes no shortness of breath at rest.   Patient was admitted and discharged on 09/11/2016 after medication overdose, taking acetaminophen causing abdominal pain and GERD.   Past Medical History:  Diagnosis Date  . Anemia   . No pertinent past medical history   . Pregnancy induced hypertension     Patient Active Problem List   Diagnosis Date Noted  . Gastroesophageal reflux disease   . Medication overdose 09/10/2016  . Depression 09/10/2016  . Abdominal pain 09/10/2016  . Acetaminophen overdose of undetermined intent   . Indication for care in labor or delivery 02/08/2014  . Normal delivery  02/08/2014  . Normal pregnancy 03/11/2011  . Decreased fetal movement 03/11/2011    Past Surgical History:  Procedure Laterality Date  . NO PAST SURGERIES    . WISDOM TOOTH EXTRACTION      OB History    Gravida Para Term Preterm AB Living   3 2 2  0 0 2   SAB TAB Ectopic Multiple Live Births   0 0 0 0 2       Home Medications    Prior to Admission medications   Medication Sig Start Date End Date Taking? Authorizing Provider  omeprazole (PRILOSEC) 20 MG capsule Take 1 capsule (20 mg total) by mouth daily. 10/25/16   Eyvonne MechanicJeffrey Alli Jasmer, PA-C    Family History Family History  Problem Relation Age of Onset  . Hypertension Mother   . Hypertension Maternal Aunt   . Hypertension Maternal Uncle   . Hypertension Maternal Grandmother   . Diabetes Maternal Grandmother     Social History Social History  Substance Use Topics  . Smoking status: Former Smoker    Quit date: 05/02/2012  . Smokeless tobacco: Never Used     Comment: 2011  . Alcohol use Yes     Allergies   Patient has no known allergies.   Review of Systems Review of Systems   Physical Exam Updated Vital Signs BP 115/81 (BP Location: Left Arm)   Pulse 67   Temp 98.7 F (37.1 C) (Oral)   Resp 20   LMP 10/02/2016   SpO2 100%  Physical Exam  Constitutional: She is oriented to person, place, and time. She appears well-developed and well-nourished.  HENT:  Head: Normocephalic and atraumatic.  Eyes: Conjunctivae are normal. Pupils are equal, round, and reactive to light. Right eye exhibits no discharge. Left eye exhibits no discharge. No scleral icterus.  Neck: Normal range of motion. No JVD present. No tracheal deviation present.  Cardiovascular: Normal rate, regular rhythm, normal heart sounds and intact distal pulses.  Exam reveals no gallop and no friction rub.   No murmur heard. Pulmonary/Chest: Effort normal and breath sounds normal. No stridor. No respiratory distress. She has no rales. She exhibits  no tenderness.  Abdominal:  Minor tenderness to palpation of the epigastric region, remainder of abdominal exam nontender.  Musculoskeletal: She exhibits no edema.  Neurological: She is alert and oriented to person, place, and time. Coordination normal.  Psychiatric: She has a normal mood and affect. Her behavior is normal. Judgment and thought content normal.  Nursing note and vitals reviewed.    ED Treatments / Results  Labs (all labs ordered are listed, but only abnormal results are displayed) Labs Reviewed  BASIC METABOLIC PANEL  CBC  I-STAT TROPOININ, ED    EKG  EKG Interpretation None       Radiology Dg Chest 2 View  Result Date: 10/25/2016 CLINICAL DATA:  Shortness of breath and chest pain EXAM: CHEST  2 VIEW COMPARISON:  June 19, 2013 FINDINGS: There is no edema or consolidation. Heart size and pulmonary vascularity are normal. No adenopathy. There is thoracic levoscoliosis. No pneumothorax. IMPRESSION: No edema or consolidation. Electronically Signed   By: Bretta Bang III M.D.   On: 10/25/2016 13:08    Procedures Procedures (including critical care time)  Medications Ordered in ED Medications  gi cocktail (Maalox,Lidocaine,Donnatal) (30 mLs Oral Given 10/25/16 1554)     Initial Impression / Assessment and Plan / ED Course  I have reviewed the triage vital signs and the nursing notes.  Pertinent labs & imaging results that were available during my care of the patient were reviewed by me and considered in my medical decision making (see chart for details).      Final Clinical Impressions(s) / ED Diagnoses   Final diagnoses:  Indigestion    Labs: Stat troponin, BMP, CBC  Imaging: DG chest 2 view  Consults:  Therapeutics:  Discharge Meds:   Assessment/Plan: 26 year old female presents today with complaints of chest pain.  Patient's presentation is most consistent with indigestion.  She is a history of the same in the past.  She has no  significant signs or risk factors for ACS , PE, dissection.  No infectious etiology.  Patient's pain was instantaneous relieved with GI cocktail here.  Patient will be placed on Prilosec, will follow up with primary care, return to the emergency room if she develops any new or worsening symptoms.  Patient verbalized understanding and agreement to today's plan had no further questions or concerns    New Prescriptions New Prescriptions   OMEPRAZOLE (PRILOSEC) 20 MG CAPSULE    Take 1 capsule (20 mg total) by mouth daily.     Eyvonne Mechanic, PA-C 10/25/16 1702    Gerhard Munch, MD 10/27/16 [phone]

## 2016-12-14 ENCOUNTER — Emergency Department (HOSPITAL_COMMUNITY)
Admission: EM | Admit: 2016-12-14 | Discharge: 2016-12-14 | Disposition: A | Discharge status: Home / Self Care | Payer: Self-pay | Attending: Dermatology | Admitting: Dermatology

## 2016-12-14 ENCOUNTER — Encounter (HOSPITAL_COMMUNITY): Payer: Self-pay | Admitting: Emergency Medicine

## 2016-12-14 DIAGNOSIS — Z5321 Procedure and treatment not carried out due to patient leaving prior to being seen by health care provider: Secondary | ICD-10-CM | POA: Insufficient documentation

## 2016-12-14 DIAGNOSIS — K0889 Other specified disorders of teeth and supporting structures: Secondary | ICD-10-CM | POA: Insufficient documentation

## 2016-12-14 NOTE — ED Notes (Signed)
Pt called for room assignment no answer 

## 2016-12-14 NOTE — ED Triage Notes (Signed)
Pt reports right upper dental pain x3 days. Denies fever

## 2016-12-14 NOTE — ED Notes (Signed)
Called for patient with no answer

## 2017-02-18 ENCOUNTER — Encounter: Payer: Self-pay | Admitting: Certified Nurse Midwife

## 2017-08-19 ENCOUNTER — Inpatient Hospital Stay (HOSPITAL_COMMUNITY): Payer: Medicaid Other

## 2017-08-19 ENCOUNTER — Inpatient Hospital Stay (HOSPITAL_COMMUNITY)
Admission: AD | Admit: 2017-08-19 | Discharge: 2017-08-19 | Disposition: A | Discharge status: Home / Self Care | Payer: Medicaid Other | Source: Ambulatory Visit | Attending: Obstetrics and Gynecology | Admitting: Obstetrics and Gynecology

## 2017-08-19 ENCOUNTER — Encounter (HOSPITAL_COMMUNITY): Payer: Self-pay | Admitting: *Deleted

## 2017-08-19 ENCOUNTER — Other Ambulatory Visit: Payer: Self-pay

## 2017-08-19 DIAGNOSIS — O209 Hemorrhage in early pregnancy, unspecified: Secondary | ICD-10-CM

## 2017-08-19 DIAGNOSIS — Z3491 Encounter for supervision of normal pregnancy, unspecified, first trimester: Secondary | ICD-10-CM

## 2017-08-19 DIAGNOSIS — Z79899 Other long term (current) drug therapy: Secondary | ICD-10-CM | POA: Diagnosis not present

## 2017-08-19 DIAGNOSIS — O208 Other hemorrhage in early pregnancy: Secondary | ICD-10-CM | POA: Diagnosis not present

## 2017-08-19 DIAGNOSIS — O418X10 Other specified disorders of amniotic fluid and membranes, first trimester, not applicable or unspecified: Secondary | ICD-10-CM

## 2017-08-19 DIAGNOSIS — N939 Abnormal uterine and vaginal bleeding, unspecified: Secondary | ICD-10-CM | POA: Diagnosis present

## 2017-08-19 DIAGNOSIS — Z3A08 8 weeks gestation of pregnancy: Secondary | ICD-10-CM | POA: Diagnosis not present

## 2017-08-19 DIAGNOSIS — O468X1 Other antepartum hemorrhage, first trimester: Secondary | ICD-10-CM

## 2017-08-19 DIAGNOSIS — Z87891 Personal history of nicotine dependence: Secondary | ICD-10-CM | POA: Insufficient documentation

## 2017-08-19 LAB — CBC
HEMATOCRIT: 30.9 % — AB (ref 36.0–46.0)
HEMOGLOBIN: 10.6 g/dL — AB (ref 12.0–15.0)
MCH: 31 pg (ref 26.0–34.0)
MCHC: 34.3 g/dL (ref 30.0–36.0)
MCV: 90.4 fL (ref 78.0–100.0)
Platelets: 185 10*3/uL (ref 150–400)
RBC: 3.42 MIL/uL — ABNORMAL LOW (ref 3.87–5.11)
RDW: 13.4 % (ref 11.5–15.5)
WBC: 3.4 10*3/uL — ABNORMAL LOW (ref 4.0–10.5)

## 2017-08-19 LAB — WET PREP, GENITAL
SPERM: NONE SEEN
TRICH WET PREP: NONE SEEN
YEAST WET PREP: NONE SEEN

## 2017-08-19 LAB — POCT PREGNANCY, URINE: Preg Test, Ur: POSITIVE — AB

## 2017-08-19 LAB — URINALYSIS, ROUTINE W REFLEX MICROSCOPIC
BILIRUBIN URINE: NEGATIVE
GLUCOSE, UA: NEGATIVE mg/dL
HGB URINE DIPSTICK: NEGATIVE
Ketones, ur: NEGATIVE mg/dL
Leukocytes, UA: NEGATIVE
Nitrite: NEGATIVE
Protein, ur: NEGATIVE mg/dL
SPECIFIC GRAVITY, URINE: 1.013 (ref 1.005–1.030)
pH: 7 (ref 5.0–8.0)

## 2017-08-19 LAB — HCG, QUANTITATIVE, PREGNANCY: hCG, Beta Chain, Quant, S: 167905 m[IU]/mL — ABNORMAL HIGH (ref <5)

## 2017-08-19 LAB — TYPE AND SCREEN
ABO/RH(D): A POS
Antibody Screen: NEGATIVE

## 2017-08-19 NOTE — MAU Note (Signed)
Having really really really bad cramps,  Pulling when she stands and walks.  Started on Friday.  On Sunday, she started passing large clots, not a period.  Yesterday, she stood up fast, got really light headed and dizzy and fainted. (witness said she was out about a minute)

## 2017-08-19 NOTE — Discharge Instructions (Signed)
Subchorionic Hematoma °A subchorionic hematoma is a gathering of blood between the outer wall of the placenta and the inner wall of the womb (uterus). The placenta is the organ that connects the fetus to the wall of the uterus. The placenta performs the feeding, breathing (oxygen to the fetus), and waste removal (excretory work) of the fetus. °Subchorionic hematoma is the most common abnormality found on a result from ultrasonography done during the first trimester or early second trimester of pregnancy. If there has been little or no vaginal bleeding, early small hematomas usually shrink on their own and do not affect your baby or pregnancy. The blood is gradually absorbed over 1-2 weeks. When bleeding starts later in pregnancy or the hematoma is larger or occurs in an older pregnant woman, the outcome may not be as good. Larger hematomas may get bigger, which increases the chances for miscarriage. Subchorionic hematoma also increases the risk of premature detachment of the placenta from the uterus, preterm (premature) labor, and stillbirth. °Follow these instructions at home: °· Stay on bed rest if your health care provider recommends this. Although bed rest will not prevent more bleeding or prevent a miscarriage, your health care provider may recommend bed rest until you are advised otherwise. °· Avoid heavy lifting (more than 10 lb [4.5 kg]), exercise, sexual intercourse, or douching as directed by your health care provider. °· Keep track of the number of pads you use each day and how soaked (saturated) they are. Write down this information. °· Do not use tampons. °· Keep all follow-up appointments as directed by your health care provider. Your health care provider may ask you to have follow-up blood tests or ultrasound tests or both. °Get help right away if: °· You have severe cramps in your stomach, back, abdomen, or pelvis. °· You have a fever. °· You pass large clots or tissue. Save any tissue for your  health care provider to look at. °· Your bleeding increases or you become lightheaded, feel weak, or have fainting episodes. °This information is not intended to replace advice given to you by your health care provider. Make sure you discuss any questions you have with your health care provider. °Document Released: 11/04/2006 Document Revised: 12/26/2015 Document Reviewed: 02/16/2013 °Elsevier Interactive Patient Education © 2017 [employer]vier Inc[employer] ° °

## 2017-08-19 NOTE — MAU Provider Note (Signed)
History     CSN: [id number]  Arrival date and time: 08/19/17 4098   First Provider Initiated Contact with Patient 08/19/17 1043      Chief Complaint  Patient presents with  . Vaginal Bleeding  . Abdominal Pain  . Dizziness   HPI Cheyenne Hahn is a 27 y.o. [id number] at [redacted]w[redacted]d by unsure LMP who presents with abdomainl pain & vaginal bleeding. Patient reports history of irregular menses & did not know that she was pregnant. Symptoms began on Friday. States she is passing large clots & bleeding heavier than menses. Has not bled since last night. She reports syncopal episode yesterday after standing up quickly & becoming dizzy. Lower abdominal pain describes as cramping. Rates pain 8/10. Pain worse with walking. Has not treated symptoms.  Denies dizziness or syncope since yesterday. Denies CP, SOB, fever/chils, dysuria.   OB History    Gravida Para Term Preterm AB Living   4 2 2  0 1 2   SAB TAB Ectopic Multiple Live Births   1 0 0 0 2      Past Medical History:  Diagnosis Date  . Anemia   . Pregnancy induced hypertension     Past Surgical History:  Procedure Laterality Date  . WISDOM TOOTH EXTRACTION      Family History  Problem Relation Age of Onset  . Hypertension Mother   . Hypertension Maternal Aunt   . Hypertension Maternal Uncle   . Hypertension Maternal Grandmother   . Diabetes Maternal Grandmother     Social History   Tobacco Use  . Smoking status: Former Smoker    Last attempt to quit: 05/02/2012    Years since quitting: 5.3  . Smokeless tobacco: Never Used  . Tobacco comment: 2011  Substance Use Topics  . Alcohol use: Yes  . Drug use: No    Allergies: No Known Allergies  Medications Prior to Admission  Medication Sig Dispense Refill Last Dose  . omeprazole (PRILOSEC) 20 MG capsule Take 1 capsule (20 mg total) by mouth daily. 30 capsule 0     Review of Systems  Gastrointestinal: Positive for abdominal pain.  Genitourinary: Positive for vaginal  bleeding.  Neurological: Positive for dizziness and syncope.   Physical Exam   Blood pressure 122/74, pulse 71, temperature 98.9 F (37.2 C), temperature source Oral, resp. rate 16, weight 114 lb 8 oz (51.9 kg), last menstrual period 06/19/2017, SpO2 100 %, unknown if currently breastfeeding.  Physical Exam  Nursing note and vitals reviewed. Constitutional: She is oriented to person, place, and time. She appears well-developed and well-nourished. No distress.  HENT:  Head: Normocephalic and atraumatic.  Eyes: Conjunctivae are normal. Right eye exhibits no discharge. Left eye exhibits no discharge. No scleral icterus.  Neck: Normal range of motion.  Respiratory: Effort normal. No respiratory distress.  GI: Soft. She exhibits no distension. There is tenderness. There is no rebound and no guarding.  Genitourinary: Uterus is tender. Uterus is not enlarged. Cervix exhibits no motion tenderness and no friability. No bleeding in the vagina. Vaginal discharge (small amount of thin yellow discharge) found.  Neurological: She is alert and oriented to person, place, and time.  Skin: Skin is warm and dry. She is not diaphoretic.  Psychiatric: She has a normal mood and affect. Her behavior is normal. Judgment and thought content normal.    MAU Course  Procedures Results for orders placed or performed during the hospital encounter of 08/19/17 (from the past 24 hour(s))  Urinalysis, Routine w  reflex microscopic     Status: None   Collection Time: 08/19/17 10:21 AM  Result Value Ref Range   Color, Urine YELLOW YELLOW   APPearance CLEAR CLEAR   Specific Gravity, Urine 1.013 1.005 - 1.030   pH 7.0 5.0 - 8.0   Glucose, UA NEGATIVE NEGATIVE mg/dL   Hgb urine dipstick NEGATIVE NEGATIVE   Bilirubin Urine NEGATIVE NEGATIVE   Ketones, ur NEGATIVE NEGATIVE mg/dL   Protein, ur NEGATIVE NEGATIVE mg/dL   Nitrite NEGATIVE NEGATIVE   Leukocytes, UA NEGATIVE NEGATIVE  Pregnancy, urine POC     Status:  Abnormal   Collection Time: 08/19/17 10:27 AM  Result Value Ref Range   Preg Test, Ur POSITIVE (A) NEGATIVE  CBC     Status: Abnormal   Collection Time: 08/19/17 10:49 AM  Result Value Ref Range   WBC 3.4 (L) 4.0 - 10.5 K/uL   RBC 3.42 (L) 3.87 - 5.11 MIL/uL   Hemoglobin 10.6 (L) 12.0 - 15.0 g/dL   HCT 16.1 (L) 09.6 - 04.5 %   MCV 90.4 78.0 - 100.0 fL   MCH 31.0 26.0 - 34.0 pg   MCHC 34.3 30.0 - 36.0 g/dL   RDW 40.9 81.1 - 91.4 %   Platelets 185 150 - 400 K/uL  hCG, quantitative, pregnancy     Status: Abnormal   Collection Time: 08/19/17 10:49 AM  Result Value Ref Range   hCG, Beta Chain, Quant, S 167,905 (H) <5 mIU/mL  Type and screen     Status: None   Collection Time: 08/19/17 10:50 AM  Result Value Ref Range   ABO/RH(D) A POS    Antibody Screen NEG    Sample Expiration 08/22/2017   Wet prep, genital     Status: Abnormal   Collection Time: 08/19/17 11:10 AM  Result Value Ref Range   Yeast Wet Prep HPF POC NONE SEEN NONE SEEN   Trich, Wet Prep NONE SEEN NONE SEEN   Clue Cells Wet Prep HPF POC PRESENT (A) NONE SEEN   WBC, Wet Prep HPF POC MODERATE (A) NONE SEEN   Sperm NONE SEEN    US Ob Less Than 14 Weeks With Ob Transvaginal  Result Date: 08/19/2017 CLINICAL DATA:  Pregnant patient with vaginal bleeding. EXAM: OBSTETRIC <14 WK [place] AND TRANSVAGINAL OB US TECHNIQUE: Both transabdominal and transvaginal ultrasound examinations were performed for complete evaluation of the gestation as well as the maternal uterus, adnexal regions, and pelvic cul-de-sac. Transvaginal technique was performed to assess early pregnancy. COMPARISON:  None. FINDINGS: Intrauterine gestational sac: Single Yolk sac:  Visualized. Embryo:  Visualized. Cardiac Activity: Visualized. Heart Rate: 180 bpm CRL:  19.4 mm   8 w   3 d                  [place] EDC: 03/28/2018 Subchorionic hemorrhage:  Small Maternal uterus/adnexae: Normal right and left ovaries. Probable left ovarian corpus luteum. IMPRESSION: Single live  intrauterine gestation.  Small subchorionic hemorrhage. Electronically Signed   By: Annia Belt M.D.   On: 08/19/2017 15:03     MDM +UPT UA, wet prep, GC/chlamydia, CBC, ABO/Rh, quant hCG, HIV, and [place] today to rule out ectopic pregnancy  VSS, NAD A positive Ultrasound shows SIUP with cardiac activity & small SCH Assessment and Plan  A: 1. Subchorionic hematoma in first trimester, single or unspecified fetus   2. Vaginal bleeding in pregnancy, first trimester   3. [redacted] weeks gestation of pregnancy   4. Normal IUP (intrauterine pregnancy) on  prenatal ultrasound, first trimester    P: Discharge home Pelvic rest Discussed reasons to return to MAU GC/CT pending Start prenatal care  Judeth Hornrin Alaster Asfaw 08/19/2017, 10:43 AM

## 2017-08-20 LAB — GC/CHLAMYDIA PROBE AMP (~~LOC~~) NOT AT ARMC
Chlamydia: NEGATIVE
NEISSERIA GONORRHEA: NEGATIVE

## 2017-08-22 ENCOUNTER — Encounter (HOSPITAL_COMMUNITY): Payer: Self-pay

## 2017-08-22 ENCOUNTER — Inpatient Hospital Stay (HOSPITAL_COMMUNITY)
Admission: AD | Admit: 2017-08-22 | Discharge: 2017-08-22 | Disposition: A | Discharge status: Home / Self Care | Payer: Medicaid Other | Source: Ambulatory Visit | Attending: Obstetrics and Gynecology | Admitting: Obstetrics and Gynecology

## 2017-08-22 DIAGNOSIS — Z87891 Personal history of nicotine dependence: Secondary | ICD-10-CM | POA: Diagnosis not present

## 2017-08-22 DIAGNOSIS — O211 Hyperemesis gravidarum with metabolic disturbance: Secondary | ICD-10-CM | POA: Insufficient documentation

## 2017-08-22 DIAGNOSIS — O219 Vomiting of pregnancy, unspecified: Secondary | ICD-10-CM

## 2017-08-22 DIAGNOSIS — Z3A09 9 weeks gestation of pregnancy: Secondary | ICD-10-CM | POA: Diagnosis not present

## 2017-08-22 DIAGNOSIS — O210 Mild hyperemesis gravidarum: Secondary | ICD-10-CM | POA: Diagnosis present

## 2017-08-22 DIAGNOSIS — E860 Dehydration: Secondary | ICD-10-CM

## 2017-08-22 DIAGNOSIS — R109 Unspecified abdominal pain: Secondary | ICD-10-CM | POA: Diagnosis not present

## 2017-08-22 LAB — URINALYSIS, ROUTINE W REFLEX MICROSCOPIC
Bilirubin Urine: NEGATIVE
Glucose, UA: NEGATIVE mg/dL
Hgb urine dipstick: NEGATIVE
Ketones, ur: 20 mg/dL — AB
LEUKOCYTES UA: NEGATIVE
Nitrite: NEGATIVE
PROTEIN: NEGATIVE mg/dL
Specific Gravity, Urine: 1.026 (ref 1.005–1.030)
pH: 6 (ref 5.0–8.0)

## 2017-08-22 LAB — COMPREHENSIVE METABOLIC PANEL
ALBUMIN: 4.1 g/dL (ref 3.5–5.0)
ALK PHOS: 27 U/L — AB (ref 38–126)
ALT: 9 U/L — AB (ref 14–54)
AST: 13 U/L — AB (ref 15–41)
Anion gap: 8 (ref 5–15)
BILIRUBIN TOTAL: 0.5 mg/dL (ref 0.3–1.2)
BUN: 8 mg/dL (ref 6–20)
CO2: 22 mmol/L (ref 22–32)
CREATININE: 0.61 mg/dL (ref 0.44–1.00)
Calcium: 9.3 mg/dL (ref 8.9–10.3)
Chloride: 106 mmol/L (ref 101–111)
GFR calc Af Amer: >60 mL/min (ref >60)
GFR calc non Af Amer: >60 mL/min (ref >60)
GLUCOSE: 91 mg/dL (ref 65–99)
POTASSIUM: 3.5 mmol/L (ref 3.5–5.1)
Sodium: 136 mmol/L (ref 135–145)
TOTAL PROTEIN: 7.7 g/dL (ref 6.5–8.1)

## 2017-08-22 LAB — CBC
HCT: 37.8 % (ref 36.0–46.0)
HEMOGLOBIN: 12.9 g/dL (ref 12.0–15.0)
MCH: 30.5 pg (ref 26.0–34.0)
MCHC: 34.1 g/dL (ref 30.0–36.0)
MCV: 89.4 fL (ref 78.0–100.0)
Platelets: 227 10*3/uL (ref 150–400)
RBC: 4.23 MIL/uL (ref 3.87–5.11)
RDW: 13.2 % (ref 11.5–15.5)
WBC: 4.5 10*3/uL (ref 4.0–10.5)

## 2017-08-22 MED ORDER — LACTATED RINGERS IV BOLUS (SEPSIS)
1000.0000 mL | Freq: Once | INTRAVENOUS | Status: AC
  Administered 2017-08-22: 1000 mL via INTRAVENOUS

## 2017-08-22 MED ORDER — PROMETHAZINE HCL 25 MG/ML IJ SOLN
25.0000 mg | Freq: Once | INTRAMUSCULAR | Status: AC
  Administered 2017-08-22: 25 mg via INTRAVENOUS
  Filled 2017-08-22: qty 1

## 2017-08-22 MED ORDER — PROMETHAZINE HCL 25 MG PO TABS: 25.0000 mg | ORAL_TABLET | Freq: Four times a day (QID) | ORAL | 0 refills | Status: DC | PRN

## 2017-08-22 MED ORDER — M.V.I. ADULT IV INJ
Freq: Once | INTRAVENOUS | Status: AC
  Administered 2017-08-22: 14:00:00 via INTRAVENOUS
  Filled 2017-08-22: qty 10

## 2017-08-22 NOTE — MAU Note (Signed)
vomitting x2 today and then got hot and sweaty and felt faint.   Some cramping but no bleeding.

## 2017-08-22 NOTE — MAU Provider Note (Signed)
History     CSN: [id number]  Arrival date and time: 08/22/17 1005   First Provider Initiated Contact with Patient 08/22/17 1036      Chief Complaint  Patient presents with  . Emesis  . Nausea   [id number] @[redacted]w[redacted]d  here with N/V x2 weeks. She is unable to tolerate anything po. Two episodes of vomiting today. She has not taken anything for it. Associated sx, feels faint. No syncope. Lying down helps the sx. She also reports lower abdominal cramping x1 week. No urinary sx. No VB or discharge. Last BM this am, no hard stools.    OB History    Gravida Para Term Preterm AB Living   4 2 2  0 1 2   SAB TAB Ectopic Multiple Live Births   1 0 0 0 2      Past Medical History:  Diagnosis Date  . Anemia   . Pregnancy induced hypertension     Past Surgical History:  Procedure Laterality Date  . WISDOM TOOTH EXTRACTION      Family History  Problem Relation Age of Onset  . Hypertension Mother   . Hypertension Maternal Aunt   . Hypertension Maternal Uncle   . Hypertension Maternal Grandmother   . Diabetes Maternal Grandmother     Social History   Tobacco Use  . Smoking status: Former Smoker    Last attempt to quit: 05/02/2012    Years since quitting: 5.3  . Smokeless tobacco: Never Used  . Tobacco comment: 2011  Substance Use Topics  . Alcohol use: Yes  . Drug use: No    Allergies: No Known Allergies  Medications Prior to Admission  Medication Sig Dispense Refill Last Dose  . Prenatal Vit-Fe Fumarate-FA (PRENATAL MULTIVITAMIN) TABS tablet Take 1 tablet by mouth daily at 12 noon.   08/22/2017 at Unknown time    Review of Systems  Gastrointestinal: Positive for abdominal pain, nausea and vomiting. Negative for constipation and diarrhea.  Genitourinary: Negative for vaginal bleeding and vaginal discharge.   Physical Exam   Blood pressure 107/62, pulse 73, temperature 98.4 F (36.9 C), temperature source Oral, resp. rate 16, height 5\' 2"  (1.575 m), weight 111 lb 12 oz (50.7  kg), last menstrual period 06/19/2017, unknown if currently breastfeeding.  Physical Exam  Constitutional: She is oriented to person, place, and time. She appears well-developed and well-nourished. No distress.  HENT:  Head: Normocephalic and atraumatic.  Neck: Normal range of motion.  Cardiovascular: Normal rate.  Respiratory: Effort normal. No respiratory distress.  GI: Soft. She exhibits no distension and no mass. There is no tenderness. There is no rebound and no guarding.  Musculoskeletal: Normal range of motion.  Neurological: She is alert and oriented to person, place, and time.  Skin: Skin is warm and dry.  Psychiatric: She has a normal mood and affect.   Limited bedside US: viable, active fetus, +cardiac activity, subj. nml AFV  Results for orders placed or performed during the hospital encounter of 08/22/17 (from the past 24 hour(s))  Urinalysis, Routine w reflex microscopic     Status: Abnormal   Collection Time: 08/22/17 10:30 AM  Result Value Ref Range   Color, Urine YELLOW YELLOW   APPearance HAZY (A) CLEAR   Specific Gravity, Urine 1.026 1.005 - 1.030   pH 6.0 5.0 - 8.0   Glucose, UA NEGATIVE NEGATIVE mg/dL   Hgb urine dipstick NEGATIVE NEGATIVE   Bilirubin Urine NEGATIVE NEGATIVE   Ketones, ur 20 (A) NEGATIVE mg/dL   Protein,  ur NEGATIVE NEGATIVE mg/dL   Nitrite NEGATIVE NEGATIVE   Leukocytes, UA NEGATIVE NEGATIVE  CBC     Status: None   Collection Time: 08/22/17 11:18 AM  Result Value Ref Range   WBC 4.5 4.0 - 10.5 K/uL   RBC 4.23 3.87 - 5.11 MIL/uL   Hemoglobin 12.9 12.0 - 15.0 g/dL   HCT 16.1 09.6 - 04.5 %   MCV 89.4 78.0 - 100.0 fL   MCH 30.5 26.0 - 34.0 pg   MCHC 34.1 30.0 - 36.0 g/dL   RDW 40.9 81.1 - 91.4 %   Platelets 227 150 - 400 K/uL  Comprehensive metabolic panel     Status: Abnormal   Collection Time: 08/22/17 11:18 AM  Result Value Ref Range   Sodium 136 135 - 145 mmol/L   Potassium 3.5 3.5 - 5.1 mmol/L   Chloride 106 101 - 111 mmol/L    CO2 22 22 - 32 mmol/L   Glucose, Bld 91 65 - 99 mg/dL   BUN 8 6 - 20 mg/dL   Creatinine, Ser 7.82 0.44 - 1.00 mg/dL   Calcium 9.3 8.9 - 95.6 mg/dL   Total Protein 7.7 6.5 - 8.1 g/dL   Albumin 4.1 3.5 - 5.0 g/dL   AST 13 (L) 15 - 41 U/L   ALT 9 (L) 14 - 54 U/L   Alkaline Phosphatase 27 (L) 38 - 126 U/L   Total Bilirubin 0.5 0.3 - 1.2 mg/dL   GFR calc non Af Amer >60 >60 mL/min   GFR calc Af Amer >60 >60 mL/min   Anion gap 8 5 - 15   MAU Course  Procedures LR MTV Phenergan  MDM Labs ordered and reviewed. No emesis. Nausea improved. Tolerating po fluids and crackers. No evidence of acute abdominal process or threatened SAB. Stable for discharge home.   Assessment and Plan   1. [redacted] weeks gestation of pregnancy   2. Nausea/vomiting in pregnancy   3. Dehydration    Discharge home Follow up at [facility] in 1-2 weeks to start care Rx Phenergan Vitamin B6 prn Eat small frequent meals, avoid hunger Maintain hydration  Allergies as of 08/22/2017   No Known Allergies     Medication List    TAKE these medications   prenatal multivitamin Tabs tablet Take 1 tablet by mouth daily at 12 noon.   promethazine 25 MG tablet Commonly known as:  PHENERGAN Take 1 tablet (25 mg total) by mouth every 6 (six) hours as needed for nausea or vomiting.      [name], CNM 08/22/2017, 2:17 PM

## 2017-08-22 NOTE — MAU Provider Note (Signed)
History     CSN: [id number]  Arrival date and time: 08/22/17 1005   First Provider Initiated Contact with Patient 08/22/17 1036      Chief Complaint  Patient presents with  . Emesis  . Nausea   HPI  Patient is a 27 y.o. [id number] at [redacted]w[redacted]d gestation by [place] on 1/17 presents to the MAU today for vomiting and feeling faint X2 weeks. She reports two episodes of vomiting this morning. She states she is unable to keep any food or liquids down. She has not taken any medication for nausea or vomiting. She reports that laying down helps relieve her symptoms. Also reports some lower middle abdominal cramping X1 week. Denies any vaginal bleeding, vaginal discharge, vaginal pain, dysuria, diarrhea, or  constipation.   She was seen in the MAU on 1/17 for abdominal pain and vaginal bleeding with a syncopal episode and was diagnosed with a subchorionic hematoma. She did not know she was pregnant up until that visit.   OB History    Gravida Para Term Preterm AB Living   4 2 2  0 1 2   SAB TAB Ectopic Multiple Live Births   1 0 0 0 2      Past Medical History:  Diagnosis Date  . Anemia   . Pregnancy induced hypertension     Past Surgical History:  Procedure Laterality Date  . WISDOM TOOTH EXTRACTION      Family History  Problem Relation Age of Onset  . Hypertension Mother   . Hypertension Maternal Aunt   . Hypertension Maternal Uncle   . Hypertension Maternal Grandmother   . Diabetes Maternal Grandmother     Social History   Tobacco Use  . Smoking status: Former Smoker    Last attempt to quit: 05/02/2012    Years since quitting: 5.3  . Smokeless tobacco: Never Used  . Tobacco comment: 2011  Substance Use Topics  . Alcohol use: Yes  . Drug use: No    Allergies: No Known Allergies  Medications Prior to Admission  Medication Sig Dispense Refill Last Dose  . Prenatal Vit-Fe Fumarate-FA (PRENATAL MULTIVITAMIN) TABS tablet Take 1 tablet by mouth daily at 12 noon.   08/22/2017 at  Unknown time    Review of Systems  Gastrointestinal: Positive for abdominal pain (lower middle cramping), nausea and vomiting. Negative for constipation and diarrhea.  Genitourinary: Positive for frequency. Negative for dysuria, vaginal bleeding, vaginal discharge and vaginal pain.   Physical Exam   Blood pressure 107/62, pulse 73, temperature 98.4 F (36.9 C), temperature source Oral, resp. rate 16, height 5\' 2"  (1.575 m), weight 50.7 kg (111 lb 12 oz), last menstrual period 06/19/2017, unknown if currently breastfeeding.  Physical Exam  Nursing note and vitals reviewed. Constitutional: She is oriented to person, place, and time. She appears well-developed and well-nourished. No distress.  HENT:  Head: Normocephalic and atraumatic.  Cardiovascular: Normal rate and regular rhythm.  Respiratory: Effort normal and breath sounds normal. No respiratory distress.  GI: Soft. There is tenderness. There is no rebound and no guarding.  Neurological: She is alert and oriented to person, place, and time.  Skin: Skin is warm and dry.   Results for orders placed or performed during the hospital encounter of 08/22/17 (from the past 24 hour(s))  Urinalysis, Routine w reflex microscopic     Status: Abnormal   Collection Time: 08/22/17 10:30 AM  Result Value Ref Range   Color, Urine YELLOW YELLOW   APPearance HAZY (A) CLEAR  Specific Gravity, Urine 1.026 1.005 - 1.030   pH 6.0 5.0 - 8.0   Glucose, UA NEGATIVE NEGATIVE mg/dL   Hgb urine dipstick NEGATIVE NEGATIVE   Bilirubin Urine NEGATIVE NEGATIVE   Ketones, ur 20 (A) NEGATIVE mg/dL   Protein, ur NEGATIVE NEGATIVE mg/dL   Nitrite NEGATIVE NEGATIVE   Leukocytes, UA NEGATIVE NEGATIVE  CBC     Status: None   Collection Time: 08/22/17 11:18 AM  Result Value Ref Range   WBC 4.5 4.0 - 10.5 K/uL   RBC 4.23 3.87 - 5.11 MIL/uL   Hemoglobin 12.9 12.0 - 15.0 g/dL   HCT 78.237.8 95.636.0 - 21.346.0 %   MCV 89.4 78.0 - 100.0 fL   MCH 30.5 26.0 - 34.0 pg   MCHC  34.1 30.0 - 36.0 g/dL   RDW 08.613.2 57.811.5 - 46.915.5 %   Platelets 227 150 - 400 K/uL  Comprehensive metabolic panel     Status: Abnormal   Collection Time: 08/22/17 11:18 AM  Result Value Ref Range   Sodium 136 135 - 145 mmol/L   Potassium 3.5 3.5 - 5.1 mmol/L   Chloride 106 101 - 111 mmol/L   CO2 22 22 - 32 mmol/L   Glucose, Bld 91 65 - 99 mg/dL   BUN 8 6 - 20 mg/dL   Creatinine, Ser 6.290.61 0.44 - 1.00 mg/dL   Calcium 9.3 8.9 - 52.810.3 mg/dL   Total Protein 7.7 6.5 - 8.1 g/dL   Albumin 4.1 3.5 - 5.0 g/dL   AST 13 (L) 15 - 41 U/L   ALT 9 (L) 14 - 54 U/L   Alkaline Phosphatase 27 (L) 38 - 126 U/L   Total Bilirubin 0.5 0.3 - 1.2 mg/dL   GFR calc non Af Amer >60 >60 mL/min   GFR calc Af Amer >60 >60 mL/min   Anion gap 8 5 - 15     MAU Course  Procedures  MDM  CBC, CMP, UA   Bedside US  Phenergan 25 mg IV  LR 1,000 mL bolus for rehydration   Now able to tolerate PO, able to be discharged home with phenergan   Assessment and Plan  Patient is a 27 y.o. [id number] with nausea and vomiting   1. [redacted] weeks gestation of pregnancy -  Plan: Discharge patient Follow up with OB as planned for prenatal care   2. Nausea/vomiting in pregnancy -  Plan: Discharge patient Phenergan 25 mg take 1 po q 6 hours prn nausea or vomiting   3. Dehydration Maintain hydration- 5-6 bottles of water per day recommended    Ilsa IhaNicole Trombetta PA-S2 08/22/2017, 2:12 PM   I confirm that I have verified the information documented in the student's note and that I have also personally reperformed the physical exam and all medical decision making activities.See my MAU note for further details.  Donette LarryBhambri, [place], CNM  08/22/2017 2:26 PM

## 2017-08-22 NOTE — Discharge Instructions (Signed)

## 2017-09-10 ENCOUNTER — Ambulatory Visit (INDEPENDENT_AMBULATORY_CARE_PROVIDER_SITE_OTHER): Payer: Medicaid Other | Admitting: Advanced Practice Midwife

## 2017-09-10 ENCOUNTER — Other Ambulatory Visit (HOSPITAL_COMMUNITY)
Admission: RE | Admit: 2017-09-10 | Discharge: 2017-09-10 | Disposition: A | Discharge status: Home / Self Care | Payer: Medicaid Other | Source: Ambulatory Visit | Attending: Obstetrics | Admitting: Obstetrics

## 2017-09-10 ENCOUNTER — Encounter: Payer: Self-pay | Admitting: Advanced Practice Midwife

## 2017-09-10 VITALS — BP 127/84 | HR 92 | Wt 112.7 lb

## 2017-09-10 DIAGNOSIS — Z3481 Encounter for supervision of other normal pregnancy, first trimester: Secondary | ICD-10-CM

## 2017-09-10 DIAGNOSIS — Z8659 Personal history of other mental and behavioral disorders: Secondary | ICD-10-CM | POA: Insufficient documentation

## 2017-09-10 DIAGNOSIS — O9989 Other specified diseases and conditions complicating pregnancy, childbirth and the puerperium: Secondary | ICD-10-CM | POA: Diagnosis not present

## 2017-09-10 DIAGNOSIS — F329 Major depressive disorder, single episode, unspecified: Secondary | ICD-10-CM

## 2017-09-10 DIAGNOSIS — Z3490 Encounter for supervision of normal pregnancy, unspecified, unspecified trimester: Secondary | ICD-10-CM

## 2017-09-10 DIAGNOSIS — Z3491 Encounter for supervision of normal pregnancy, unspecified, first trimester: Secondary | ICD-10-CM | POA: Insufficient documentation

## 2017-09-10 DIAGNOSIS — F32A Depression, unspecified: Secondary | ICD-10-CM

## 2017-09-10 NOTE — Progress Notes (Signed)
ge

## 2017-09-10 NOTE — Progress Notes (Signed)
   PRENATAL VISIT NOTE  Subjective:  Cheyenne Hahn is a 27 y.o. [id number] at 2836w6d being seen today for initial prenatal visit.  She is currently monitored for the following issues for this low-risk pregnancy and has Medication overdose; Depression; Acetaminophen overdose of undetermined intent; Gastroesophageal reflux disease; Encounter for supervision of normal pregnancy, unspecified, unspecified trimester; and History of postpartum depression, currently pregnant on their problem list.  Patient reports no complaints.  Contractions: Not present. Vag. Bleeding: None.   . Denies leaking of fluid.   The following portions of the patient's history were reviewed and updated as appropriate: allergies, current medications, past family history, past medical history, past social history, past surgical history and problem list. Problem list updated.  Objective:   Vitals:   09/10/17 1028  BP: 127/84  Pulse: 92  Weight: 112 lb 11.2 oz (51.1 kg)    Fetal Status: Fetal Heart Rate (bpm): 142         VS reviewed, nursing note reviewed,  Constitutional: well developed, well nourished, no distress HEENT: normocephalic CV: normal rate Pulm/chest wall: normal effort Breast Exam:  Deferred Abdomen: soft Neuro: alert and oriented x 3 Skin: warm, dry Psych: affect normal Pelvic exam: Cervix pink, visually closed, without lesion, scant white creamy discharge, vaginal walls and external genitalia normal Bimanual exam: Cervix 0/long/high, firm, anterior, neg CMT, uterus nontender, ~ 11 week size, adnexa without tenderness, enlargement, or mass  Assessment and Plan:  Pregnancy: [id number] at 7536w6d  1. Encounter for supervision of normal pregnancy, antepartum, unspecified gravidity --Doing well, no longer nauseous, not needing medications --GC/C done in MAU 08/19/17, electrophoresis done with previous pregnancy --Discussed genetic screening as optional testing, pt opts for NIPS testing - Cytology - PAP -  Culture, OB Urine - Obstetric Panel, Including HIV - Genetic Screening - Cystic Fibrosis Mutation 97  2. History of postpartum depression, currently pregnant --Pt with hx of intermittent depression, worse PP.   --Medication overdose 1 year ago, 08/2016 with inpatient admission --Reports doing better now, wants to work on her depression for herself and her family  --She has appt for counseling but is unsure about insurance - [facility] --[facility]  Preterm labor symptoms and general obstetric precautions including but not limited to vaginal bleeding, contractions, leaking of fluid and fetal movement were reviewed in detail with the patient. Please refer to After Visit Summary for other counseling recommendations.  Return in about 4 weeks (around 10/08/2017).   Sharen CounterLisa Leftwich-Kirby, CNM

## 2017-09-12 LAB — CULTURE, OB URINE

## 2017-09-12 LAB — URINE CULTURE, OB REFLEX

## 2017-09-13 LAB — CYTOLOGY - PAP: Diagnosis: NEGATIVE

## 2017-09-20 ENCOUNTER — Encounter: Payer: Self-pay | Admitting: *Deleted

## 2017-09-21 LAB — OBSTETRIC PANEL, INCLUDING HIV
ANTIBODY SCREEN: NEGATIVE
BASOS ABS: 0 10*3/uL (ref 0.0–0.2)
BASOS: 0 %
EOS (ABSOLUTE): 0 10*3/uL (ref 0.0–0.4)
Eos: 1 %
HIV SCREEN 4TH GENERATION: NONREACTIVE
Hematocrit: 35.6 % (ref 34.0–46.6)
Hemoglobin: 12.1 g/dL (ref 11.1–15.9)
Hepatitis B Surface Ag: NEGATIVE
Immature Grans (Abs): 0 10*3/uL (ref 0.0–0.1)
Immature Granulocytes: 0 %
Lymphocytes Absolute: 1.7 10*3/uL (ref 0.7–3.1)
Lymphs: 30 %
MCH: 29.7 pg (ref 26.6–33.0)
MCHC: 34 g/dL (ref 31.5–35.7)
MCV: 88 fL (ref 79–97)
MONOCYTES: 5 %
MONOS ABS: 0.3 10*3/uL (ref 0.1–0.9)
NEUTROS ABS: 3.7 10*3/uL (ref 1.4–7.0)
Neutrophils: 64 %
PLATELETS: 259 10*3/uL (ref 150–379)
RBC: 4.07 x10E6/uL (ref 3.77–5.28)
RDW: 13.7 % (ref 12.3–15.4)
RPR Ser Ql: NONREACTIVE
Rh Factor: POSITIVE
Rubella Antibodies, IGG: 4.25 index (ref >0.99)
WBC: 5.7 10*3/uL (ref 3.4–10.8)

## 2017-09-21 LAB — CYSTIC FIBROSIS MUTATION 97: GENE DIS ANAL CARRIER INTERP BLD/T-IMP: NOT DETECTED

## 2017-10-05 ENCOUNTER — Encounter: Payer: Self-pay | Admitting: Obstetrics and Gynecology

## 2017-10-05 DIAGNOSIS — O139 Gestational [pregnancy-induced] hypertension without significant proteinuria, unspecified trimester: Secondary | ICD-10-CM | POA: Insufficient documentation

## 2017-10-07 ENCOUNTER — Encounter: Payer: Self-pay | Admitting: Obstetrics and Gynecology

## 2017-11-01 ENCOUNTER — Encounter: Payer: Self-pay | Admitting: Obstetrics

## 2017-11-01 ENCOUNTER — Inpatient Hospital Stay (HOSPITAL_COMMUNITY): Payer: Medicaid Other

## 2017-11-01 ENCOUNTER — Inpatient Hospital Stay (HOSPITAL_COMMUNITY)
Admission: AD | Admit: 2017-11-01 | Discharge: 2017-11-01 | Discharge status: Against Medical Advice | Payer: Medicaid Other | Source: Ambulatory Visit | Attending: Obstetrics and Gynecology | Admitting: Obstetrics and Gynecology

## 2017-11-01 ENCOUNTER — Ambulatory Visit (INDEPENDENT_AMBULATORY_CARE_PROVIDER_SITE_OTHER): Payer: Medicaid Other | Admitting: Obstetrics

## 2017-11-01 ENCOUNTER — Inpatient Hospital Stay (HOSPITAL_COMMUNITY)
Admission: AD | Admit: 2017-11-01 | Discharge: 2017-11-01 | Disposition: A | Discharge status: Home / Self Care | Payer: Medicaid Other | Source: Ambulatory Visit | Attending: Obstetrics & Gynecology | Admitting: Obstetrics & Gynecology

## 2017-11-01 DIAGNOSIS — Z5321 Procedure and treatment not carried out due to patient leaving prior to being seen by health care provider: Secondary | ICD-10-CM | POA: Diagnosis not present

## 2017-11-01 DIAGNOSIS — Z87891 Personal history of nicotine dependence: Secondary | ICD-10-CM | POA: Insufficient documentation

## 2017-11-01 DIAGNOSIS — O368120 Decreased fetal movements, second trimester, not applicable or unspecified: Secondary | ICD-10-CM | POA: Diagnosis present

## 2017-11-01 DIAGNOSIS — Z3A19 19 weeks gestation of pregnancy: Secondary | ICD-10-CM | POA: Insufficient documentation

## 2017-11-01 DIAGNOSIS — Z6790 Unspecified blood type, Rh positive: Secondary | ICD-10-CM

## 2017-11-01 DIAGNOSIS — Z3482 Encounter for supervision of other normal pregnancy, second trimester: Secondary | ICD-10-CM

## 2017-11-01 DIAGNOSIS — O034 Incomplete spontaneous abortion without complication: Secondary | ICD-10-CM

## 2017-11-01 DIAGNOSIS — IMO0002 Reserved for concepts with insufficient information to code with codable children: Secondary | ICD-10-CM

## 2017-11-01 DIAGNOSIS — O039 Complete or unspecified spontaneous abortion without complication: Secondary | ICD-10-CM

## 2017-11-01 DIAGNOSIS — Z3490 Encounter for supervision of normal pregnancy, unspecified, unspecified trimester: Secondary | ICD-10-CM

## 2017-11-01 LAB — CBC
HCT: 33.5 % — ABNORMAL LOW (ref 36.0–46.0)
Hemoglobin: 11 g/dL — ABNORMAL LOW (ref 12.0–15.0)
MCH: 29.4 pg (ref 26.0–34.0)
MCHC: 32.8 g/dL (ref 30.0–36.0)
MCV: 89.6 fL (ref 78.0–100.0)
PLATELETS: 293 10*3/uL (ref 150–400)
RBC: 3.74 MIL/uL — ABNORMAL LOW (ref 3.87–5.11)
RDW: 13.1 % (ref 11.5–15.5)
WBC: 4.7 10*3/uL (ref 4.0–10.5)

## 2017-11-01 LAB — HCG, QUANTITATIVE, PREGNANCY: HCG, BETA CHAIN, QUANT, S: 6 m[IU]/mL — AB (ref <5)

## 2017-11-01 MED ORDER — OXYCODONE-ACETAMINOPHEN 5-325 MG PO TABS: 1.0000 | ORAL_TABLET | Freq: Four times a day (QID) | ORAL | 0 refills | Status: DC | PRN

## 2017-11-01 MED ORDER — IBUPROFEN 600 MG PO TABS: 600.0000 mg | ORAL_TABLET | Freq: Four times a day (QID) | ORAL | 0 refills | Status: DC | PRN

## 2017-11-01 MED ORDER — MISOPROSTOL 200 MCG PO TABS: 800.0000 ug | ORAL_TABLET | Freq: Once | ORAL | 0 refills | Status: DC

## 2017-11-01 MED ORDER — ONDANSETRON 4 MG PO TBDP: 4.0000 mg | ORAL_TABLET | Freq: Three times a day (TID) | ORAL | 0 refills | Status: DC | PRN

## 2017-11-01 NOTE — MAU Note (Signed)
Pt here to get results. States here earlier but had to leave.

## 2017-11-01 NOTE — Discharge Instructions (Signed)
Incomplete Miscarriage °A miscarriage is the sudden loss of an unborn baby (fetus) before the 20th week of pregnancy. In an incomplete miscarriage, parts of the fetus or placenta (afterbirth) remain in the body. °Having a miscarriage can be an emotional experience. Talk with your health care provider about any questions you may have about miscarrying, the grieving process, and your future pregnancy plans. °What are the causes? °· Problems with the fetal chromosomes that make it impossible for the baby to develop normally. Problems with the baby's genes or chromosomes are most often the result of errors that occur by chance as the embryo divides and grows. The problems are not inherited from the parents. °· Infection of the cervix or uterus. °· Hormone problems. °· Problems with the cervix, such as having an incompetent cervix. This is when the tissue in the cervix is not strong enough to hold the pregnancy. °· Problems with the uterus, such as an abnormally shaped uterus, uterine fibroids, or congenital abnormalities. °· Certain medical conditions. °· Smoking, drinking alcohol, or taking illegal drugs. °· Trauma. °What are the signs or symptoms? °· Vaginal bleeding or spotting, with or without cramps or pain. °· Pain or cramping in the abdomen or lower back. °· Passing fluid, tissue, or blood clots from the vagina. °How is this diagnosed? °Your health care provider will perform a physical exam. You may also have an ultrasound to confirm the miscarriage. Blood or urine tests may also be ordered. °How is this treated? °· Usually, a dilation and curettage (D&C) procedure is performed. During a D&C procedure, the cervix is widened (dilated) and any remaining fetal or placental tissue is gently removed from the uterus. °· Antibiotic medicines are prescribed if there is an infection. Other medicines may be given to reduce the size of the uterus (contract) if there is a lot of bleeding. °· If you have Rh negative blood and  your baby was Rh positive, you will need a Rho (D) immune globulin shot. This shot will protect any future baby from having Rh blood problems in future pregnancies. °· You may be confined to bed rest. This means you should stay in bed and only get up to use the bathroom. °Follow these instructions at home: °· Rest as directed by your health care provider. °· Restrict activity as directed by your health care provider. You may be allowed to continue light activity if curettage was not done but you require further treatment. °· Keep track of the number of pads you use each day. Keep track of how soaked (saturated) they are. Record this information. °· Do not  use tampons. °· Do not douche or have sexual intercourse until approved by your health care provider. °· Keep all follow-up appointments for reevaluation and continuing management. °· Only take over-the-counter or prescription medicines for pain, fever, or discomfort as directed by your health care provider. °· Take antibiotic medicine as directed by your health care provider. Make sure you finish it even if you start to feel better. °Get help right away if: °· You experience severe cramps in your stomach, back, or abdomen. °· You have an unexplained temperature (make sure to record these temperatures). °· You pass large clots or tissue (save these for your health care provider to inspect). °· Your bleeding increases. °· You become light-headed, weak, or have fainting episodes. °This information is not intended to replace advice given to you by your health care provider. Make sure you discuss any questions you have with your health   care provider. °Document Released: 07/20/2005 Document Revised: 12/26/2015 Document Reviewed: 02/16/2013 °Elsevier Interactive Patient Education © 2017 [employer] ° °FACTS YOU SHOULD KNOW ° °WHAT IS AN EARLY PREGNANCY FAILURE? °Once the egg is fertilized with the sperm and begins to develop, it attaches to the lining of the uterus.  This early pregnancy tissue may not develop into an embryo (the beginning stage of a baby). Sometimes an embryo does develop but does not continue to grow. These problems can be seen on ultrasound.  ° °MANAGEMNT OF EARLY PREGNANCY FAILURE: °About 4 out of 100 (0.25%) women will have a pregnancy loss in her lifetime.  One in five pregnancies is found to be an early pregnancy failure.  There are 3 ways to care for an early pregnancy failure:   °(1) Surgery, (2) Medicine, (3) Waiting for you to pass the pregnancy on your own. °The decision as to how to proceed after being diagnosed with and early pregnancy failure is an individual one.  The decision can be made only after appropriate counseling.  You need to weigh the pros and cons of the 3 choices. Then you can make the choice that works for you. °SURGERY (D&E) °• Procedure over in 1 day °• Requires being put to sleep °• Bleeding may be light °• Possible problems during surgery, including injury to womb(uterus) °• Care provider has more control °Medicine (CYTOTEC) °• The complete procedure may take days to weeks °• No Surgery °• Bleeding may be heavy at times °• There may be drug side effects °• Patient has more control °Waiting °• You may choose to wait, in which case your own body may complete the passing of the abnormal early pregnancy on its own in about 2-4 weeks °• Your bleeding may be heavy at times °• There is a small possibility that you may need surgery if the bleeding is too much or not all of the pregnancy has passed. °CYTOTEC MANAGEMENT °Prostaglandins (cytotec) are the most widely used drug for this purpose. They cause the uterus to cramp and contract. You will place the medicine yourself inside your vagina in the privacy of your home. Empting of the uterus should occur within 3 days but the process may continue for several weeks. The bleeding may seem heavy at times. °POSSIBLE SIDE EFFECTS FROM CYTOTEC °• Nausea    Vomiting °• Diarrhea Fever °• Chills  Hot Flashes °Side effects  from the process of the early pregnancy failure include: °• Cramping  Bleeding °• Headaches  Dizziness °RISKS: °This is a low risk procedure. Less than 1 in 100 women has a complication. An incomplete passage of the early pregnancy may occur. Also, Hemorrhage (heavy bleeding) could happen.  Rarely the pregnancy will not be passed completely. Excessively heavy bleeding may occur.  Your doctor may need to perform surgery to empty the uterus (D&E). °Afterwards: °Everybody will feel differently after the early pregnancy completion. You may have soreness or cramps for a day or two. You may have soreness or cramps for day or two.  You may have light bleeding for up to 2 weeks. You may be as active as you feel like being. °If you have any of the following problems you may call [facility]missions [facility] at [phone]. °• If you have pain that does not get better  with pain medication °• Bleeding that soaks through 2 thick full-sized sanitary pads in an hour °• Cramps that last longer than 2 days °• Foul smelling discharge °• Fever above 100.4 degrees   F °Even if you do not have any of these symptoms, you should have a follow-up exam to make sure you are healing properly. This appointment will be made for you before you leave the hospital. Your next normal period will start again in 4-6 week after the loss. You can get pregnant soon after the loss, so use birth control right away. °Finally: °Make sure all your questions are answered before during and after any procedure. Follow up with medical care and family planning methods. ° °  ° ° °

## 2017-11-01 NOTE — MAU Provider Note (Signed)
History     CSN: [id number]  Arrival date and time: 11/01/17 1517   First Provider Initiated Contact with Patient 11/01/17 1547      Chief Complaint  Patient presents with  . Decreased Fetal Movement  . Other    No fetal heart rate detected in office - sent from office "Dr. Clearance Coots"   [id number] @19 .2 wks sent from office d/t no FHT heard at PN visit today. She reports daily VB over the last month. Has been changing about 3 pads per day and passing golf ball sized clots. Reports uterine cramping for the last 2 weeks. Rates pain 2/10. Has not taken anything for it.    OB History    Gravida  5   Para  2   Term  2   Preterm  0   AB  2   Living  2     SAB  1   TAB  1   Ectopic  0   Multiple  0   Live Births  2           Past Medical History:  Diagnosis Date  . Anemia   . Pregnancy induced hypertension     Past Surgical History:  Procedure Laterality Date  . WISDOM TOOTH EXTRACTION      Family History  Problem Relation Age of Onset  . Hypertension Mother   . Hypertension Maternal Aunt   . Hypertension Maternal Uncle   . Hypertension Maternal Grandmother   . Diabetes Maternal Grandmother     Social History   Tobacco Use  . Smoking status: Former Smoker    Last attempt to quit: 05/02/2012    Years since quitting: 5.5  . Smokeless tobacco: Never Used  . Tobacco comment: 2011  Substance Use Topics  . Alcohol use: Yes    Comment: occ  . Drug use: Yes    Types: Marijuana    Comment: stopped when pregnancy confirmed    Allergies: No Known Allergies  Medications Prior to Admission  Medication Sig Dispense Refill Last Dose  . omeprazole (PRILOSEC) 20 MG capsule Take 20 mg by mouth daily as needed (heartburn).   Past Week at Unknown time  . Prenatal Vit-Fe Fumarate-FA (PRENATAL MULTIVITAMIN) TABS tablet Take 1 tablet by mouth daily at 12 noon.   11/01/2017 at Unknown time  . ranitidine (ZANTAC) 150 MG tablet Take 150 mg by mouth at bedtime.    10/31/2017 at Unknown time  . promethazine (PHENERGAN) 25 MG tablet Take 1 tablet (25 mg total) by mouth every 6 (six) hours as needed for nausea or vomiting. (Patient not taking: Reported on 11/01/2017) 30 tablet 0 Not Taking    Review of Systems  Constitutional: Negative for fever.  Gastrointestinal: Positive for abdominal pain.  Genitourinary: Positive for vaginal bleeding.   Physical Exam   Blood pressure 121/82, pulse 79, temperature 98.6 F (37 C), temperature source Oral, resp. rate 18, height 5\' 2"  (1.575 m), weight 117 lb (53.1 kg), last menstrual period 06/19/2017, SpO2 100 %, unknown if currently breastfeeding.  Physical Exam  Constitutional: She is oriented to person, place, and time. She appears well-developed and well-nourished. No distress.  HENT:  Head: Normocephalic and atraumatic.  Neck: Normal range of motion.  Cardiovascular: Normal rate.  Respiratory: Effort normal. No respiratory distress.  GI: Soft. She exhibits no distension and no mass. There is no tenderness. There is no rebound and no guarding.  Genitourinary:  Genitourinary Comments: External: no lesions or erythema Vagina: rugated,  pink, moist, small bloody discharge Uterus: non enlarged, anteverted, non tender, no CMT Adnexae: no masses, no tenderness left, no tenderness right Cervix closed/long   Musculoskeletal: Normal range of motion.  Neurological: She is alert and oriented to person, place, and time.  Skin: Skin is warm and dry.  Psychiatric: She has a normal mood and affect.   No results found for this or any previous visit (from the past 24 hour(s)).  No results found.  MAU Course  Procedures  MDM Labs and US ordered and reviewed. Previous US on 08/19/17 at 8 wks showed a viable IUP with small SCH. Awaiting labs and US results. Pt requesting to leave to pick up kids. Evaluation and mngt not complete at this time, do not recommend leaving as I am awaiting more information. Pt left AMA. States  she will return.   Assessment and Plan   1. Absent fetal heart tones    Left AMA  Donette LarryMelanie Ulas Zuercher, CNM 11/01/2017, 5:22 PM

## 2017-11-01 NOTE — MAU Provider Note (Signed)
History     CSN: [id number]  Arrival date and time: 11/01/17 1828  [id number] @19 .2 wks sent from office for no FHTs. She was seen earlier in MAU for evaluation but had to leave prior to results were complete. She reports daily VB over the last month. Has been changing about 3 pads per day and passing golf ball sized clots. Did not see any tissue. Reports uterine cramping for the last 2 weeks. Rates pain 2/10. Has not taken anything for it.    OB History    Gravida  5   Para  2   Term  2   Preterm  0   AB  2   Living  2     SAB  1   TAB  1   Ectopic  0   Multiple  0   Live Births  2           Past Medical History:  Diagnosis Date  . Anemia   . Pregnancy induced hypertension     Past Surgical History:  Procedure Laterality Date  . WISDOM TOOTH EXTRACTION      Family History  Problem Relation Age of Onset  . Hypertension Mother   . Hypertension Maternal Aunt   . Hypertension Maternal Uncle   . Hypertension Maternal Grandmother   . Diabetes Maternal Grandmother     Social History   Tobacco Use  . Smoking status: Former Smoker    Last attempt to quit: 05/02/2012    Years since quitting: 5.5  . Smokeless tobacco: Never Used  . Tobacco comment: 2011  Substance Use Topics  . Alcohol use: Yes    Comment: occ  . Drug use: Yes    Types: Marijuana    Comment: stopped when pregnancy confirmed    Allergies: No Known Allergies  No medications prior to admission.    Review of Systems  Gastrointestinal: Positive for abdominal pain (intermittent).  Genitourinary: Positive for vaginal bleeding.   Physical Exam   Blood pressure (!) 126/55, pulse 64, temperature 98.5 F (36.9 C), temperature source Oral, resp. rate 16, last menstrual period 06/19/2017, SpO2 98 %, unknown if currently breastfeeding.  Physical Exam  Constitutional: She is oriented to person, place, and time. She appears well-developed and well-nourished. No distress.  HENT:  Head:  Normocephalic and atraumatic.  Neck: Normal range of motion.  Cardiovascular: Normal rate.  Respiratory: Effort normal. No respiratory distress.  Musculoskeletal: Normal range of motion.  Neurological: She is alert and oriented to person, place, and time.  Psychiatric: She has a normal mood and affect.    Results for orders placed or performed during the hospital encounter of 11/01/17 (from the past 24 hour(s))  CBC     Status: Abnormal   Collection Time: 11/01/17  5:17 PM  Result Value Ref Range   WBC 4.7 4.0 - 10.5 K/uL   RBC 3.74 (L) 3.87 - 5.11 MIL/uL   Hemoglobin 11.0 (L) 12.0 - 15.0 g/dL   HCT 65.733.5 (L) 84.636.0 - 96.246.0 %   MCV 89.6 78.0 - 100.0 fL   MCH 29.4 26.0 - 34.0 pg   MCHC 32.8 30.0 - 36.0 g/dL   RDW 95.213.1 84.111.5 - 32.415.5 %   Platelets 293 150 - 400 K/uL  hCG, quantitative, pregnancy     Status: Abnormal   Collection Time: 11/01/17  5:17 PM  Result Value Ref Range   hCG, Beta Chain, Quant, S 6 (H) <5 mIU/mL   [place] Ob Comp Less 14  Wks  Result Date: 11/01/2017 CLINICAL DATA:  Absent fetal heart tones. EXAM: OBSTETRIC <14 WK ULTRASOUND TECHNIQUE: Transabdominal ultrasound was performed for evaluation of the gestation as well as the maternal uterus and adnexal regions. COMPARISON:  Pelvic ultrasound dated August 19, 2017. FINDINGS: Intrauterine gestational sac: None Yolk sac:  Not Visualized. Embryo:  Not Visualized. Cardiac Activity: Not Visualized. Subchorionic hemorrhage:  None visualized. Maternal uterus/adnexae: Prominent vascularity within the endometrium without focal abnormality. The bilateral ovaries are unremarkable. IMPRESSION: 1. No visible gestational sac or embryo, consistent with failed pregnancy. 2. Prominent vascularity within the endometrium. Correlate for retained products of conception. Electronically Signed   By: Obie Dredge M.D.   On: 11/01/2017 18:28   MAU Course  Procedures  MDM Labs and [place] reviewed. US shows retained POC and quant HCG of 6, both indicating  SAB. Discussed findings with pt. Support offered. Recommend Cytotec. Pt opts for Rx and will take medication tomorrow morning. Instructed to call [facility] if does not bleed or pass tissue as she may need a second dose after 48 hrs. Stable for discharge home.  Assessment and Plan   1. Spontaneous abortion   2. Retained products of conception after miscarriage   3. Blood type, Rh positive    Discharge home Follow up at [facility] in 1 week Bleeding/return precautions- has reliable transportation Rx Cytotec Rx Zofran Rx Ibuprofen Rx Oxycodone #8  Allergies as of 11/01/2017   No Known Allergies     Medication List    STOP taking these medications   omeprazole 20 MG capsule Commonly known as:  PRILOSEC   promethazine 25 MG tablet Commonly known as:  PHENERGAN   ranitidine 150 MG tablet Commonly known as:  ZANTAC     TAKE these medications   ibuprofen 600 MG tablet Commonly known as:  ADVIL,MOTRIN Take 1 tablet (600 mg total) by mouth every 6 (six) hours as needed.   misoprostol 200 MCG tablet Commonly known as:  CYTOTEC Take 4 tablets (800 mcg total) by mouth once for 1 dose.   ondansetron 4 MG disintegrating tablet Commonly known as:  ZOFRAN ODT Take 1 tablet (4 mg total) by mouth every 8 (eight) hours as needed for nausea or vomiting.   oxyCODONE-acetaminophen 5-325 MG tablet Commonly known as:  PERCOCET/ROXICET Take 1-2 tablets by mouth every 6 (six) hours as needed for severe pain.   prenatal multivitamin Tabs tablet Take 1 tablet by mouth daily at 12 noon.      Cheyenne Hahn, CNM 11/01/2017, 7:52 PM

## 2017-11-01 NOTE — MAU Note (Signed)
Urine in lab 

## 2017-11-01 NOTE — MAU Note (Signed)
Pt. Left AMA, "I have to pick up my kids." AMA form signed; pt. Verbalized understanding.

## 2017-11-01 NOTE — Progress Notes (Signed)
Patient states that she has not started feeling fetal movement yet, pt states that she has been having light brownish/red bleeding since 09-10-17.

## 2017-11-01 NOTE — Progress Notes (Signed)
Subjective:  Cheyenne Hahn is a 27 y.o. [id number] at 7012w2d being seen today for ongoing prenatal care.  She is currently monitored for the following issues for this low-risk pregnancy and has Medication overdose; Depression; Acetaminophen overdose of undetermined intent; Gastroesophageal reflux disease; Encounter for supervision of normal pregnancy, unspecified, unspecified trimester; History of postpartum depression, currently pregnant; and Pregnancy induced hypertension on their problem list.  Patient reports brownish vaginal spotting since Februrary.  Contractions: Not present. Vag. Bleeding: Scant.   . Denies leaking of fluid.   The following portions of the patient's history were reviewed and updated as appropriate: allergies, current medications, past family history, past medical history, past social history, past surgical history and problem list. Problem list updated.  Objective:   Vitals:   11/01/17 1405  BP: 122/81  Pulse: 80  Weight: 116 lb 12.8 oz (53 kg)    Fetal Status:           General:  Alert, oriented and cooperative. Patient is in no acute distress.  Skin: Skin is warm and dry. No rash noted.   Cardiovascular: Normal heart rate noted  Respiratory: Normal respiratory effort, no problems with respiration noted  Abdomen: Soft, gravid, appropriate for gestational age. Pain/Pressure: Absent     Pelvic:  Cervical exam deferred        Extremities: Normal range of motion.  Edema: None  Mental Status: Normal mood and affect. Normal behavior. Normal judgment and thought content.   Urinalysis:      Assessment and Plan:  Pregnancy: [id number] at 7212w2d  1. Encounter for supervision of normal pregnancy, antepartum, unspecified gravidity - FHR absent with Doppler - [facility]  Preterm labor symptoms and general obstetric precautions including but not limited to vaginal bleeding, contractions, leaking of fluid and fetal movement were  reviewed in detail with the patient. Please refer to After Visit Summary for other counseling recommendations.  Return in about 1 month (around 11/29/2017) for ROB.   Brock BadHarper, Charles A, MD

## 2017-11-15 ENCOUNTER — Encounter (HOSPITAL_COMMUNITY): Payer: Self-pay | Admitting: Psychiatry

## 2017-11-15 ENCOUNTER — Ambulatory Visit (INDEPENDENT_AMBULATORY_CARE_PROVIDER_SITE_OTHER): Payer: Medicaid Other | Admitting: Psychiatry

## 2017-11-15 VITALS — BP 123/82 | HR 88 | Ht 62.0 in | Wt 117.0 lb

## 2017-11-15 DIAGNOSIS — F419 Anxiety disorder, unspecified: Secondary | ICD-10-CM

## 2017-11-15 DIAGNOSIS — Z6229 Other upbringing away from parents: Secondary | ICD-10-CM

## 2017-11-15 DIAGNOSIS — F1290 Cannabis use, unspecified, uncomplicated: Secondary | ICD-10-CM

## 2017-11-15 DIAGNOSIS — F4312 Post-traumatic stress disorder, chronic: Secondary | ICD-10-CM

## 2017-11-15 DIAGNOSIS — R450 Nervousness: Secondary | ICD-10-CM

## 2017-11-15 DIAGNOSIS — G4700 Insomnia, unspecified: Secondary | ICD-10-CM | POA: Diagnosis not present

## 2017-11-15 DIAGNOSIS — F4010 Social phobia, unspecified: Secondary | ICD-10-CM | POA: Diagnosis not present

## 2017-11-15 DIAGNOSIS — O99345 Other mental disorders complicating the puerperium: Secondary | ICD-10-CM

## 2017-11-15 DIAGNOSIS — F53 Postpartum depression: Secondary | ICD-10-CM

## 2017-11-15 DIAGNOSIS — Z87891 Personal history of nicotine dependence: Secondary | ICD-10-CM

## 2017-11-15 MED ORDER — FLUOXETINE HCL 10 MG PO CAPS: 10.0000 mg | ORAL_CAPSULE | Freq: Every day | ORAL | 2 refills | Status: DC

## 2017-11-15 MED ORDER — QUETIAPINE FUMARATE 50 MG PO TABS: ORAL_TABLET | ORAL | 2 refills | Status: DC
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
## 2017-11-30 ENCOUNTER — Ambulatory Visit (INDEPENDENT_AMBULATORY_CARE_PROVIDER_SITE_OTHER): Payer: Medicaid Other | Admitting: Certified Nurse Midwife

## 2017-11-30 ENCOUNTER — Encounter: Payer: Self-pay | Admitting: Certified Nurse Midwife

## 2017-11-30 ENCOUNTER — Ambulatory Visit: Payer: Medicaid Other | Admitting: Certified Nurse Midwife

## 2017-11-30 VITALS — BP 115/78 | HR 73 | Wt 117.0 lb

## 2017-11-30 DIAGNOSIS — Z1389 Encounter for screening for other disorder: Secondary | ICD-10-CM | POA: Diagnosis not present

## 2017-11-30 DIAGNOSIS — Z30013 Encounter for initial prescription of injectable contraceptive: Secondary | ICD-10-CM

## 2017-11-30 DIAGNOSIS — Z3042 Encounter for surveillance of injectable contraceptive: Secondary | ICD-10-CM | POA: Diagnosis not present

## 2017-11-30 DIAGNOSIS — O039 Complete or unspecified spontaneous abortion without complication: Secondary | ICD-10-CM | POA: Diagnosis not present

## 2017-11-30 MED ORDER — MEDROXYPROGESTERONE ACETATE 150 MG/ML IM SUSP: 150.0000 mg | INTRAMUSCULAR | 4 refills | Status: DC

## 2017-11-30 MED ORDER — MEDROXYPROGESTERONE ACETATE 150 MG/ML IM SUSP
150.0000 mg | Freq: Once | INTRAMUSCULAR | Status: AC
Start: 2017-11-30 — End: 2017-11-30
  Administered 2017-11-30: 150 mg via INTRAMUSCULAR

## 2017-11-30 NOTE — Addendum Note (Signed)
Addended by: Marjo Bicker, Tyrail Grandfield A on: 11/30/2017 12:00 PM   Modules accepted: Orders

## 2017-11-30 NOTE — Progress Notes (Signed)
Subjective:     Cheyenne Hahn is a 27 y.o. female who presents for a postpartum visit. She is 4 weeks postpartum following a SAB at 19.2 EGA. Outcome: spontaneous abortion. Anesthesia: none.  Bleeding no bleeding and stopped bleeding 4 days ago\. Bowel function is normal. Bladder function is normal. Patient is not sexually active. Contraception method is abstinence. Postpartum depression screening: positive, followed by behavioral health, had visit 11/15/17 for PTSD and recent SAB.  Tobacco, alcohol and substance abuse history reviewed.  Adult immunizations reviewed including TDAP, rubella and varicella.  States is doing better with sleep.  Discussed normal grief reaction/feelings.  Desires to start on Depo injections for birth control.   The following portions of the patient's history were reviewed and updated as appropriate: allergies, current medications, past family history, past medical history, past social history, past surgical history and problem list.  Review of Systems Pertinent items noted in HPI and remainder of comprehensive ROS otherwise negative.   Objective:    BP 115/78   Pulse 73   Wt 117 lb (53.1 kg)   LMP 06/19/2017   Breastfeeding? No   BMI 21.40 kg/m   General:  alert, cooperative and no distress   Breasts:  inspection negative, no nipple discharge or bleeding, no masses or nodularity palpable  Lungs: clear to auscultation bilaterally  Heart:  regular rate and rhythm, S1, S2 normal, no murmur, click, rub or gallop  Abdomen: soft, non-tender; bowel sounds normal; no masses,  no organomegaly  Pelvic/Rectal Exam: Not performed.       Pap Smear: 09/10/17: normal   50% of 20 min visit spent on counseling and coordination of care.   Assessment & Plan      1. Contraception: Depo-Provera injections, 1st dose given today in office 2. Recent SAB with hx of depression: stable 3. Follow up in: 3 months or as needed.  2hr GTT for h/o GDM/screening for DM q 3 yrs per ADA  recommendations Preconception counseling provided Healthy lifestyle practices reviewed

## 2017-11-30 NOTE — Progress Notes (Signed)
Pt has been seen by psych and started on medications, has follow up.  Pt was given depo at today's visit. Depo was supplied by office stock. Tolerated injection well.   Pt advised to RTO July 22 for next injection.   Administrations This Visit    medroxyPROGESTERone (DEPO-PROVERA) injection 150 mg    Admin Date 11/30/2017 Action Given Dose 150 mg Route Intramuscular Administered By Lanney Gins, CMA

## 2017-12-01 NOTE — Progress Notes (Signed)
No exam

## 2017-12-07 ENCOUNTER — Ambulatory Visit (HOSPITAL_COMMUNITY): Payer: Self-pay | Admitting: Licensed Clinical Social Worker

## 2017-12-20 ENCOUNTER — Ambulatory Visit (INDEPENDENT_AMBULATORY_CARE_PROVIDER_SITE_OTHER): Payer: Medicaid Other | Admitting: Psychiatry

## 2017-12-20 ENCOUNTER — Encounter (HOSPITAL_COMMUNITY): Payer: Self-pay | Admitting: Psychiatry

## 2017-12-20 DIAGNOSIS — Z87891 Personal history of nicotine dependence: Secondary | ICD-10-CM

## 2017-12-20 DIAGNOSIS — R454 Irritability and anger: Secondary | ICD-10-CM | POA: Diagnosis not present

## 2017-12-20 DIAGNOSIS — F53 Postpartum depression: Secondary | ICD-10-CM | POA: Diagnosis not present

## 2017-12-20 DIAGNOSIS — F4312 Post-traumatic stress disorder, chronic: Secondary | ICD-10-CM | POA: Diagnosis not present

## 2017-12-20 DIAGNOSIS — O99345 Other mental disorders complicating the puerperium: Secondary | ICD-10-CM | POA: Diagnosis not present

## 2017-12-20 MED ORDER — FLUOXETINE HCL 20 MG PO CAPS: 20.0000 mg | ORAL_CAPSULE | Freq: Every day | ORAL | 0 refills | Status: DC

## 2017-12-20 MED ORDER — QUETIAPINE FUMARATE 50 MG PO TABS: ORAL_TABLET | ORAL | 2 refills | Status: DC

## 2017-12-20 NOTE — Progress Notes (Signed)
BH MD/PA/NP OP Progress Note  12/20/2017 4:26 PM Cheyenne Hahn  MRN:  [id number]  Chief Complaint: depression, mood labiltiy  HPI: Cheyenne Hahn reports that she continues to have some depression, irritability, up and down mood.  She has been missing work quite a bit because of feeling frustrated and not wanting to socialize with people.  She has not noticed any benefit yet with the Prozac, but denies any side effects.  She reports that she loves the Seroquel and is sleeping really well at night.  We discussed increasing Prozac to 20 mg, and continuing the current dose of Seroquel at 100 mg nightly.  She denies any acute suicidality and feels encouraged that she is starting to make some improvements particularly in her sleep.  Spent time discussing some adaptive strategies including exercise, encouraged her to start therapy scheduled for next week, discussed some coping strategies that she can begin working on actively.  Visit Diagnosis:    ICD-10-CM   1. Chronic post-traumatic stress disorder (PTSD) F43.12 FLUoxetine (PROZAC) 20 MG capsule    QUEtiapine (SEROQUEL) 50 MG tablet  2. 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 Reviewed, with no updates at this time.   Past Medical History:  Past Medical History:  Diagnosis Date  . Anemia   . Anxiety   . Depression   . Pregnancy induced hypertension     Past Surgical History:  Procedure Laterality Date  . WISDOM TOOTH EXTRACTION      Family Psychiatric History: See intake H&P for full details. Reviewed, with no updates at this time.   Family History:  Family History  Problem Relation Age of Onset  . Hypertension Mother   . Hypertension Maternal Aunt   . Hypertension Maternal Uncle   . Hypertension Maternal Grandmother   . Diabetes Maternal Grandmother     Social History:  Social History    Socioeconomic History  . Marital status: Single    Spouse name: Not on file  . Number of children: 2  . Years of education: Not on file  . Highest education level: Not on file  Occupational History  . Not on file  Social Needs  . Financial resource strain: Somewhat hard  . Food insecurity:    Worry: Sometimes true    Inability: Sometimes true  . Transportation needs:    Medical: No    Non-medical: No  Tobacco Use  . Smoking status: Former Smoker    Last attempt to quit: 05/02/2012    Years since quitting: 5.6  . Smokeless tobacco: Never Used  . Tobacco comment: 2011  Substance and Sexual Activity  . Alcohol use: Yes    Comment: occ  . Drug use: Yes    Types: Marijuana    Comment: stopped when pregnancy confirmed  . Sexual activity: Yes    Partners: Male    Birth control/protection: None  Lifestyle  . Physical activity:    Days per week: 0 days    Minutes per session: 0 min  . Stress: Rather much  Relationships  . Social connections:    Talks on phone: Twice a week    Gets together: Never    Attends religious service: Never    Active member of club or organization: No    Attends meetings of clubs or organizations: Never    Relationship status: Never married  Other Topics  Concern  . Not on file  Social History Narrative  . Not on file    Allergies: No Known Allergies  Metabolic Disorder Labs: No results found for: HGBA1C, MPG No results found for: PROLACTIN No results found for: CHOL, TRIG, HDL, CHOLHDL, VLDL, LDLCALC Lab Results  Component Value Date   TSH 1.423 09/10/2016    Therapeutic Level Labs: No results found for: LITHIUM No results found for: VALPROATE No components found for:  CBMZ  Current Medications: Current Outpatient Medications  Medication Sig Dispense Refill  . FLUoxetine (PROZAC) 20 MG capsule Take 1 capsule (20 mg total) by mouth daily. 90 capsule 0  . ibuprofen (ADVIL,MOTRIN) 600 MG tablet Take 1 tablet (600 mg total) by mouth  every 6 (six) hours as needed. 30 tablet 0  . [START ON 02/18/2018] medroxyPROGESTERone (DEPO-PROVERA) 150 MG/ML injection Inject 1 mL (150 mg total) into the muscle every 3 (three) months. 1 mL 4  . QUEtiapine (SEROQUEL) 50 MG tablet Take 50-100 mg nightly for sleep 60 tablet 2  . misoprostol (CYTOTEC) 200 MCG tablet Take 4 tablets (800 mcg total) by mouth once for 1 dose. 4 tablet 0   No current facility-administered medications for this visit.      Musculoskeletal: Strength & Muscle Tone: within normal limits Gait & Station: normal Patient leans: N/A  Psychiatric Specialty Exam: ROS  Blood pressure 117/75, pulse (!) 106, height  (1.575 m), weight 117 lb 12.8 oz (53.4 kg), SpO2 100 %.Body mass index is 21.55 kg/m.  General Appearance: Casual and Fairly Groomed  Eye Contact:  Fair  Speech:  Clear and Coherent and Normal Rate  Volume:  Normal  Mood:  Anxious and Dysphoric  Affect:  Congruent  Thought Process:  Coherent, Goal Directed and Descriptions of Associations: Intact  Orientation:  Full (Time, Place, and Person)  Thought Content: Logical   Suicidal Thoughts:  No  Homicidal Thoughts:  No  Memory:  Immediate;   Fair  Judgement:  Fair  Insight:  Fair  Psychomotor Activity:  Normal  Concentration:  Attention Span: Fair  Recall:  [employer] of Knowledge: Fair  Language: Fair  Akathisia:  Negative  Handed:  Right  AIMS (if indicated): not done  Assets:  Communication Skills Desire for Improvement Financial Resources/Insurance Housing  ADL's:  Intact  Cognition: WNL  Sleep:  Fair   Screenings:   Assessment and Plan: Cheyenne Hahn presents with improving sleep, and slight improvement in her irritability.  She continues to struggle with prominent PTSD symptoms, and mood lability and would benefit from further titration of Prozac and Seroquel as below.  She has not had any intolerability or side effects from either medication.  I spent time with her sharing the  news of my departure from the clinic in August, and discussing a plan of care moving forward thereafter.  1. Chronic post-traumatic stress disorder (PTSD)   2. Post partum depression     Status of current problems: gradually improving  Labs Ordered: No orders of the defined types were placed in this encounter.   Labs Reviewed: n/a  Collateral Obtained/Records Reviewed: n/a  Plan:  Increase Prozac to 20 mg daily, Seroquel increased to 100 mg nightly Return to clinic in 6 weeks Therapy next week, intake with Wes  I spent 20 minutes with the patient in direct face-to-face clinical care.  Greater than 50% of this time was spent in counseling and coordination of care with the patient.    Burnard Leigh,  MD 12/20/2017, 4:26 PM

## 2017-12-29 ENCOUNTER — Ambulatory Visit (INDEPENDENT_AMBULATORY_CARE_PROVIDER_SITE_OTHER): Payer: Medicaid Other | Admitting: Licensed Clinical Social Worker

## 2017-12-29 ENCOUNTER — Encounter (HOSPITAL_COMMUNITY): Payer: Self-pay | Admitting: Licensed Clinical Social Worker

## 2017-12-29 DIAGNOSIS — F4312 Post-traumatic stress disorder, chronic: Secondary | ICD-10-CM | POA: Diagnosis not present
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
## 2018-01-26 ENCOUNTER — Ambulatory Visit (INDEPENDENT_AMBULATORY_CARE_PROVIDER_SITE_OTHER): Payer: Self-pay | Admitting: Licensed Clinical Social Worker

## 2018-01-26 DIAGNOSIS — F4312 Post-traumatic stress disorder, chronic: Secondary | ICD-10-CM

## 2018-01-26 NOTE — Progress Notes (Signed)
Pt did not show for appointment, no call.

## 2018-01-31 ENCOUNTER — Ambulatory Visit (HOSPITAL_COMMUNITY): Payer: Medicaid Other | Admitting: Psychiatry

## 2018-02-21 ENCOUNTER — Ambulatory Visit: Payer: Medicaid Other

## 2018-06-17 ENCOUNTER — Other Ambulatory Visit: Payer: Self-pay

## 2018-06-17 ENCOUNTER — Emergency Department (HOSPITAL_COMMUNITY)
Admission: EM | Admit: 2018-06-17 | Discharge: 2018-06-17 | Disposition: A | Discharge status: Home / Self Care | Payer: Medicaid Other | Attending: Emergency Medicine | Admitting: Emergency Medicine

## 2018-06-17 ENCOUNTER — Encounter (HOSPITAL_COMMUNITY): Payer: Self-pay | Admitting: Emergency Medicine

## 2018-06-17 DIAGNOSIS — R21 Rash and other nonspecific skin eruption: Secondary | ICD-10-CM | POA: Insufficient documentation

## 2018-06-17 DIAGNOSIS — Z79899 Other long term (current) drug therapy: Secondary | ICD-10-CM | POA: Insufficient documentation

## 2018-06-17 DIAGNOSIS — Z87891 Personal history of nicotine dependence: Secondary | ICD-10-CM | POA: Diagnosis not present

## 2018-06-17 MED ORDER — DIPHENHYDRAMINE HCL 25 MG PO TABS: 25.0000 mg | ORAL_TABLET | Freq: Four times a day (QID) | ORAL | 0 refills | Status: DC | PRN

## 2018-06-17 MED ORDER — PREDNISONE 10 MG PO TABS: 20.0000 mg | ORAL_TABLET | Freq: Two times a day (BID) | ORAL | 0 refills | Status: AC

## 2018-06-17 MED ORDER — FLUCONAZOLE 200 MG PO TABS: 200.0000 mg | ORAL_TABLET | ORAL | 0 refills | Status: AC

## 2018-06-17 NOTE — ED Notes (Signed)
Patient able to ambulate independently  

## 2018-06-17 NOTE — Discharge Instructions (Signed)
Your rash appears consistent with ringworm.  Take the fluconazole once weekly for 4 weeks. Start taking prednisone as prescribed for itching.  You can also take Benadryl up to 4 times daily as needed for itching.  If the Benadryl makes you sleepy I recommend only taking this at night but during the day you can take over-the-counter nondrowsy allergy medicine such as Zyrtec or Allegra.  You could also take Pepcid up to twice daily as needed for itching.  Follow-up with dermatology or a primary care physician for reevaluation of your rash.  Return to the emergency department if any concerning signs or symptoms develop such as difficulty breathing or swallowing, facial swelling, fevers, vomiting

## 2018-06-17 NOTE — ED Triage Notes (Signed)
Pt arrives to ED from home with complaints of multiple rashes appearing on her neck, chest, arms and legs for the last two weeks. Pt reports they itch and burn. Pt placed in position of comfort with bed locked and lowered, call bell in reach.

## 2018-06-17 NOTE — ED Provider Notes (Signed)
[facility]   CSN: [id number] Arrival date & time: 06/17/18  1028     History   Chief Complaint Chief Complaint  Patient presents with  . Rash    HPI Cheyenne Hahn is a 27 y.o. female presents for evaluation of acute onset, progressively worsening rash for 2 weeks.  She states that the rash appeared on her neck first and has since spread to her upper and lower extremities, chest, abdomen, and back.  She notes that it is pruritic and burning.  Denies fevers, facial swelling, nausea, vomiting, shortness of breath, chest pain, difficulty breathing or swallowing.  No new soaps, shampoos, detergents, lotions, or medications.  She has tried topical corticosteroid with minimal relief of her symptoms.  The history is provided by the patient.    Past Medical History:  Diagnosis Date  . Anemia   . Anxiety   . Depression   . Pregnancy induced hypertension     Patient Active Problem List   Diagnosis Date Noted  . Gastroesophageal reflux disease   . Medication overdose 09/10/2016  . Depression 09/10/2016  . Acetaminophen overdose of undetermined intent     Past Surgical History:  Procedure Laterality Date  . WISDOM TOOTH EXTRACTION       OB History    Gravida  5   Para  2   Term  2   Preterm  0   AB  2   Living  2     SAB  1   TAB  1   Ectopic  0   Multiple  0   Live Births  2            Home Medications    Prior to Admission medications   Medication Sig Start Date End Date Taking? Authorizing Provider  diphenhydrAMINE (BENADRYL) 25 MG tablet Take 1 tablet (25 mg total) by mouth every 6 (six) hours as needed for itching. 06/17/18   Lachlyn Vanderstelt A, PA-C  fluconazole (DIFLUCAN) 200 MG tablet Take 1 tablet (200 mg total) by mouth once a week for 4 doses. 06/17/18 07/09/18  Michela PitcherFawze, [place] Kretz A, PA-C  FLUoxetine (PROZAC) 20 MG capsule Take 1 capsule (20 mg total) by mouth daily. 12/20/17 12/20/18  Burnard LeighEksir,  Alexander Arya, MD  ibuprofen (ADVIL,MOTRIN) 600 MG tablet Take 1 tablet (600 mg total) by mouth every 6 (six) hours as needed. 11/01/17   Donette LarryBhambri, Melanie, CNM  medroxyPROGESTERone (DEPO-PROVERA) 150 MG/ML injection Inject 1 mL (150 mg total) into the muscle every 3 (three) months. 02/18/18   Orvilla Cornwallenney, Rachelle A, CNM  misoprostol (CYTOTEC) 200 MCG tablet Take 4 tablets (800 mcg total) by mouth once for 1 dose. 11/01/17 11/01/17  Donette LarryBhambri, Melanie, CNM  predniSONE (DELTASONE) 10 MG tablet Take 2 tablets (20 mg total) by mouth 2 (two) times daily with a meal for 4 days. 06/17/18 06/21/18  Michela PitcherFawze, Desaray Marschner A, PA-C  QUEtiapine (SEROQUEL) 50 MG tablet Take 50-100 mg nightly for sleep 12/20/17   Rene KocherEksir, Bo McclintockAlexander Arya, MD    Family History Family History  Problem Relation Age of Onset  . Hypertension Mother   . Hypertension Maternal Aunt   . Hypertension Maternal Uncle   . Hypertension Maternal Grandmother   . Diabetes Maternal Grandmother     Social History Social History   Tobacco Use  . Smoking status: Former Smoker    Last attempt to quit: 05/02/2012    Years since quitting: 6.1  . Smokeless tobacco: Never Used  .  Tobacco comment: 2011  Substance Use Topics  . Alcohol use: Yes    Comment: occ  . Drug use: Yes    Types: Marijuana    Comment: stopped when pregnancy confirmed     Allergies   Patient has no known allergies.   Review of Systems Review of Systems  Constitutional: Negative for chills and fever.  HENT: Negative for facial swelling.   Respiratory: Negative for shortness of breath.   Cardiovascular: Negative for chest pain.  Gastrointestinal: Negative for abdominal pain, nausea and vomiting.  Skin: Positive for rash.  All other systems reviewed and are negative.    Physical Exam Updated Vital Signs BP 124/76 (BP Location: Right Arm)   Pulse 89   Temp 98.4 F (36.9 C) (Oral)   Ht 5\' 3"  (1.6 m)   Wt 62.6 kg   SpO2 100%   BMI 24.45 kg/m   Physical Exam    Constitutional: She appears well-developed and well-nourished. No distress.  HENT:  Head: Normocephalic and atraumatic.  No swelling of lips or tongue.  Tolerating secretions without difficulty.  Normal phonation.  No upper airway stridor.  Eyes: Conjunctivae are normal. Right eye exhibits no discharge. Left eye exhibits no discharge.  Neck: No JVD present. No tracheal deviation present.  Cardiovascular: Normal rate.  Pulmonary/Chest: Effort normal.  Taking in full sentences without difficulty.  Abdominal: She exhibits no distension.  Musculoskeletal: She exhibits no edema.  Neurological: She is alert.  Skin: Skin is warm and dry. Rash noted. No erythema.  See image below.  Patient with numerous hyperpigmented scaly annular lesions with some central clearing to the neck, chest, back, abdomen, upper and lower extremities, proximally more than distally.  Some satellite lesions noted.  Psychiatric: She has a normal mood and affect. Her behavior is normal.  Nursing note and vitals reviewed.      ED Treatments / Results  Labs (all labs ordered are listed, but only abnormal results are displayed) Labs Reviewed - No data to display  EKG None  Radiology No results found.  Procedures Procedures (including critical care time)  Medications Ordered in ED Medications - No data to display   Initial Impression / Assessment and Plan / ED Course  I have reviewed the triage vital signs and the nursing notes.  Pertinent labs & imaging results that were available during my care of the patient were reviewed by me and considered in my medical decision making (see chart for details).     Rash suggestive of ringworm.  Patient is afebrile, vital signs are stable.  She is nontoxic in appearance.  Patient denies any difficulty breathing or swallowing.  Pt has a patent airway without stridor and is handling secretions without difficulty; no angioedema. No blisters, no pustules, no warmth, no  draining sinus tracts, no superficial abscesses, no bullous impetigo, no vesicles, no desquamation, no target lesions with dusky purpura or a central bulla. Not tender to touch. No concern for superimposed infection. No concern for SJS, TEN, TSS, tick borne illness, syphilis or other life-threatening condition. Will discharge home with short course of steroids, pepcid and recommend Benadryl as needed for pruritis.  Given diffuse nature of rash, will discharge with systemic antifungal therapy, fluconazole 200 mg once weekly for 4 weeks.  Discussed strict ED return precautions.  Recommend follow-up with PCP or dermatology for reevaluation of symptoms. Pt verbalized understanding of and agreement with plan and is safe for discharge home at this time.     Final Clinical Impressions(s) /  ED Diagnoses   Final diagnoses:  Rash    ED Discharge Orders         Ordered    fluconazole (DIFLUCAN) 200 MG tablet  Weekly     06/17/18 1108    predniSONE (DELTASONE) 10 MG tablet  2 times daily with meals     06/17/18 1108    diphenhydrAMINE (BENADRYL) 25 MG tablet  Every 6 hours PRN     06/17/18 1108           Jeanie Sewer, PA-C 06/17/18 1111    Tilden Fossa, MD 06/18/18 1045

## 2019-03-08 ENCOUNTER — Ambulatory Visit (INDEPENDENT_AMBULATORY_CARE_PROVIDER_SITE_OTHER): Payer: BC Managed Care – PPO | Admitting: Certified Nurse Midwife

## 2019-03-08 ENCOUNTER — Other Ambulatory Visit: Payer: Self-pay

## 2019-03-08 ENCOUNTER — Other Ambulatory Visit (HOSPITAL_COMMUNITY)
Admission: RE | Admit: 2019-03-08 | Discharge: 2019-03-08 | Disposition: A | Discharge status: Home / Self Care | Payer: BC Managed Care – PPO | Source: Ambulatory Visit | Attending: Certified Nurse Midwife | Admitting: Certified Nurse Midwife

## 2019-03-08 ENCOUNTER — Encounter: Payer: Self-pay | Admitting: Certified Nurse Midwife

## 2019-03-08 VITALS — BP 107/69 | HR 81 | Wt 139.8 lb

## 2019-03-08 DIAGNOSIS — Z3202 Encounter for pregnancy test, result negative: Secondary | ICD-10-CM | POA: Diagnosis not present

## 2019-03-08 DIAGNOSIS — N911 Secondary amenorrhea: Secondary | ICD-10-CM

## 2019-03-08 DIAGNOSIS — Z01419 Encounter for gynecological examination (general) (routine) without abnormal findings: Secondary | ICD-10-CM

## 2019-03-08 DIAGNOSIS — Z113 Encounter for screening for infections with a predominantly sexual mode of transmission: Secondary | ICD-10-CM | POA: Diagnosis present

## 2019-03-08 LAB — POCT URINE PREGNANCY: Preg Test, Ur: NEGATIVE

## 2019-03-08 MED ORDER — MEDROXYPROGESTERONE ACETATE 10 MG PO TABS: 10.0000 mg | ORAL_TABLET | Freq: Every day | ORAL | 0 refills | Status: DC

## 2019-03-08 NOTE — Patient Instructions (Signed)
Medroxyprogesterone tablets What is this medicine? MEDROXYPROGESTERONE (me DROX ee proe JES te rone) is a hormone in a class called progestins. It is commonly used to prevent the uterine lining from overgrowth in women taking an estrogen after menopause. It is also used to treat irregular menstrual bleeding or a lack of menstrual bleeding in women. This medicine may be used for other purposes; ask your health care provider or pharmacist if you have questions. COMMON BRAND NAME(S): Amen, Provera What should I tell my health care provider before I take this medicine? They need to know if you have any of these conditions:  blood vessel disease or a history of a blood clot in the lungs or legs  breast, cervical or vaginal cancer  heart disease  kidney disease  liver disease  migraine  recent miscarriage or abortion  mental depression  migraine  seizures (convulsions)  stroke  vaginal bleeding that has not been evaluated  an unusual or allergic reaction to medroxyprogesterone, other medicines, foods, dyes, or preservatives  pregnant or trying to get pregnant  breast-feeding How should I use this medicine? Take this medicine by mouth with a glass of water. Follow the directions on the prescription label. Take your doses at regular intervals. Do not take your medicine more often than directed. Talk to your pediatrician regarding the use of this medicine in children. Special care may be needed. While this drug may be prescribed for children as young as 13 years for selected conditions, precautions do apply. Overdosage: If you think you have taken too much of this medicine contact a poison control center or emergency room at once. NOTE: This medicine is only for you. Do not share this medicine with others. What if I miss a dose? If you miss a dose, take it as soon as you can. If it is almost time for your next dose, take only that dose. Do not take double or extra doses. What may  interact with this medicine?  barbiturate medicines for inducing sleep or treating seizures (convulsions)  bosentan  carbamazepine  phenytoin  rifampin  St. John's Wort This list may not describe all possible interactions. Give your health care provider a list of all the medicines, herbs, non-prescription drugs, or dietary supplements you use. Also tell them if you smoke, drink alcohol, or use illegal drugs. Some items may interact with your medicine. What should I watch for while using this medicine? Visit your health care professional for regular checks on your progress. You will need a regular breast and pelvic exam. If you have any reason to think you are pregnant, stop taking this medicine at once and contact your doctor or health care professional. What side effects may I notice from receiving this medicine? Side effects that you should report to your doctor or health care professional as soon as possible:  breast tenderness or discharge  changes in mood or emotions, such as depression  changes in vision or speech  pain in the abdomen, chest, groin, or leg  severe headache  skin rash, itching, or hives  sudden shortness of breath  unusually weak or tired  yellowing of skin or eyes Side effects that usually do not require medical attention (report to your doctor or health care professional if they continue or are bothersome):  acne  change in menstrual bleeding pattern or flow  changes in sexual desire  facial hair growth  fluid retention and swelling  headache  upset stomach  weight gain or loss This list  may not describe all possible side effects. Call your doctor for medical advice about side effects. You may report side effects to FDA at 1-800-FDA-1088. Where should I keep my medicine? Keep out of the reach of children. Store at room temperature between 20 and 25 degrees C (68 and 77 degrees F). Throw away any unused medicine after the expiration  date. NOTE: This sheet is a summary. It may not cover all possible information. If you have questions about this medicine, talk to your doctor, pharmacist, or health care provider.  2020 Elsevier/Gold Standard (2008-07-19 11:26:12)   Secondary Amenorrhea  Secondary amenorrhea occurs when a female who was previously having menstrual periods has not had them for 3-6 months. A menstrual period is the monthly shedding of the lining of the uterus. Menstruation involves the passing of blood, tissue, fluid, and mucus through the vagina. The flow of blood usually occurs during 3-7 consecutive days each month. This condition has many causes. In many cases, treating the underlying cause will return menstrual periods back to a normal cycle. What are the causes? The most common cause of this condition is pregnancy. Other causes include:  Malnutrition.  Cirrhosis of the liver.  Conditions of the blood.  Diabetes.  Epilepsy.  Chronic kidney disease.  Polycystic ovary disease.  Stress or anxiety.  A hormonal imbalance.  Ovarian failure.  Medicines.  Extreme obesity.  Cystic fibrosis.  Low body weight or drastic weight loss.  Early menopause.  Removal of the ovaries or uterus.  Contraceptive pills, patches, or vaginal rings.  Cushing syndrome.  Thyroid problems. What increases the risk? You are more likely to develop this condition if:  You have a family history of this condition.  You have an eating disorder.  You do extreme athletic training.  You have a chronic disease.  You abuse substances such as alcohol or cigarettes. What are the signs or symptoms? The main symptom of this condition is a lack of menstrual periods for 3-6 months. How is this diagnosed? This condition may be diagnosed based on:  Your medical history.  A physical exam.  A pelvic exam to check for problems with your reproductive organs.  A procedure to examine the uterus.  A measurement  of your body mass index (BMI).  Tests, such as: ? Blood tests that measure certain hormones in your body and rule out pregnancy. ? Urine tests. ? Imaging tests, such as an ultrasound, CT scan, or MRI. How is this treated? Treatment for this condition depends on the cause of the amenorrhea. It may involve:  Correcting dietary problems.  Treating underlying conditions.  Medicines.  Lifestyle changes.  Surgery. If the condition cannot be corrected, it is sometimes possible to trigger menstrual periods with medicines. Follow these instructions at home: Lifestyle  Maintain a healthy diet. Ask to meet with a registered dietitian for nutrition counseling and meal planning.  Maintain a healthy weight. Talk to your health care provider before trying any new diet or exercise plan.  Exercise at least 30 minutes 5 or more days each week. Exercising includes brisk walking, yard work, biking, running, swimming, and team sports like basketball and soccer. Ask your health care provider which exercises are safe for you.  Get enough sleep. Plan your sleep time to allow for 7-9 hours of sleep each night.  Learn to manage stress. Explore relaxation techniques such as meditation, journaling, yoga, or tai chi. General instructions  Be aware of changes in your menstrual cycle. Keep a record of  when you have your menstrual period. Note the date your period starts, how long it lasts, and any problems you experience.  Take over-the-counter and prescription medicines only as told by your health care provider.  Keep all follow-up visits as told by your health care provider. This is important. Contact a health care provider if:  Your periods do not return to normal after treatment. Summary  Secondary amenorrhea is when a female who was previously having menstrual periods has not gotten her period for 3-6 months.  This condition has many causes. In many cases, treating the underlying cause will return  menstrual periods back to a normal cycle.  Talk to your health care provider if your periods do not return to normal after treatment. This information is not intended to replace advice given to you by your health care provider. Make sure you discuss any questions you have with your health care provider. Document Released: 08/31/2006 Document Revised: 12/30/2017 Document Reviewed: 10/08/2016 Elsevier Patient Education  2020 [employer].

## 2019-03-08 NOTE — Progress Notes (Signed)
Sti testing today with blood work Last pap-2019 negative UPT negative

## 2019-03-08 NOTE — Progress Notes (Signed)
GYNECOLOGY ANNUAL PREVENTATIVE CARE ENCOUNTER NOTE  History:     Cheyenne Hahn is a 28 y.o. [id number] female here for a routine annual gynecologic exam and STI testing. Current complaints: no period since May.  Denies abnormal discharge, pelvic pain, problems with intercourse or other gynecologic concerns.    Gynecologic History Patient's last menstrual period was 12/02/2018. Contraception: none. Pt is trying to conceive Last Pap: April 2019. Results were: normal with negative HPV   Obstetric History OB History  Gravida Para Term Preterm AB Living  5 2 2  0 2 2  SAB TAB Ectopic Multiple Live Births  1 1 0 0 2    # Outcome Date GA Lbr Len/2nd Weight Sex Delivery Anes PTL Lv  5 Gravida           4 Term 02/08/14 8020w1d 09:20 / 00:16 6 lb 15.6 oz (3.165 kg) F Vag-Spont EPI  LIV  3 Term 03/12/11 3839w1d 27:09 / 00:36 6 lb 13.5 oz (3.104 kg) F Vag-Spont EPI N LIV     Birth Comments: induction for PIH  2 TAB           1 SAB             Past Medical History:  Diagnosis Date  . Anemia   . Anxiety   . Depression   . Pregnancy induced hypertension     Past Surgical History:  Procedure Laterality Date  . WISDOM TOOTH EXTRACTION      Current Outpatient Medications on File Prior to Visit  Medication Sig Dispense Refill  . diphenhydrAMINE (BENADRYL) 25 MG tablet Take 1 tablet (25 mg total) by mouth every 6 (six) hours as needed for itching. (Patient not taking: Reported on 03/08/2019) 20 tablet 0  . FLUoxetine (PROZAC) 20 MG capsule Take 1 capsule (20 mg total) by mouth daily. 90 capsule 0  . ibuprofen (ADVIL,MOTRIN) 600 MG tablet Take 1 tablet (600 mg total) by mouth every 6 (six) hours as needed. (Patient not taking: Reported on 03/08/2019) 30 tablet 0  . medroxyPROGESTERone (DEPO-PROVERA) 150 MG/ML injection Inject 1 mL (150 mg total) into the muscle every 3 (three) months. (Patient not taking: Reported on 03/08/2019) 1 mL 4  . misoprostol (CYTOTEC) 200 MCG tablet Take 4 tablets (800  mcg total) by mouth once for 1 dose. 4 tablet 0  . QUEtiapine (SEROQUEL) 50 MG tablet Take 50-100 mg nightly for sleep (Patient not taking: Reported on 03/08/2019) 60 tablet 2   No current facility-administered medications on file prior to visit.     No Known Allergies  Social History:  reports that she quit smoking about 6 years ago. She has never used smokeless tobacco. She reports current alcohol use. She reports current drug use. Drug: Marijuana.  Family History  Problem Relation Age of Onset  . Hypertension Mother   . Hypertension Maternal Aunt   . Hypertension Maternal Uncle   . Hypertension Maternal Grandmother   . Diabetes Maternal Grandmother     The following portions of the patient's history were reviewed and updated as appropriate: allergies, current medications, past family history, past medical history, past social history, past surgical history and problem list.  Review of Systems Pertinent items noted in HPI and remainder of comprehensive ROS otherwise negative.  Physical Exam:  BP 107/69   Pulse 81   Wt 139 lb 12.8 oz (63.4 kg)   LMP 12/02/2018   BMI 24.76 kg/m  CONSTITUTIONAL: Well-developed, well-nourished female in no acute distress.  HENT:  Normocephalic, atraumatic, External right and left ear normal. Oropharynx is clear and moist EYES: Conjunctivae and EOM are normal. Pupils are equal, round, and reactive to light. NECK: Normal range of motion, supple, no masses.  Normal thyroid.  SKIN: Skin is warm and dry. No rash noted. Not diaphoretic. No erythema. No pallor. MUSCULOSKELETAL: Normal range of motion. No tenderness.  No cyanosis, clubbing, or edema.  2+ distal pulses. NEUROLOGIC: Alert and oriented to person, place, and time. Normal reflexes, muscle tone coordination. No cranial nerve deficit noted. PSYCHIATRIC: Normal mood and affect. Normal behavior. Normal judgment and thought content. CARDIOVASCULAR: Normal heart rate noted, regular rhythm  RESPIRATORY: Clear to auscultation bilaterally. Effort and breath sounds normal, no problems with respiration noted. BREASTS: Symmetric in size. No masses, skin changes, nipple drainage, or lymphadenopathy. ABDOMEN: Soft, normal bowel sounds, no distention noted.  No tenderness, rebound or guarding.  PELVIC: Normal appearing external genitalia; Normal uterine size, no other palpable masses, no uterine or adnexal tenderness.   Assessment and Plan:    1. Women's annual routine gynecological examination - Normal Pap April 2019. Pap not done today - Abnormal well woman examination d/t secondary amenorrhea   2. Screening for STDs (sexually transmitted diseases) - Patient desire full STD screening including lab work  - Cervicovaginal ancillary only( [place]) - HIV Antibody (routine testing w rflx) - RPR - Hepatitis B Surface AntiGEN - Hepatitis C Antibody  3. Secondary amenorrhea - Patient reports regular cycles prior to May, has not had symptoms of cycle with cramping or spotting. UPT negative today in office.  - No period since 12/02/2018 - Trying to conceive  - Educated and discussed options of waiting 2-3 months for spontaneous cycle or initiating provera challenge to assess for bleeding, patient decided on doing 10 day provera challenge.  - Initial lab work obtained to assess hormone levels. If negative HCG then MyChart message will be sent to patient to start provera challenge.  - POCT urine pregnancy - TSH - FSH - 17-Hydroxyprogesterone - Prolactin - medroxyPROGESTERone (PROVERA) 10 MG tablet; Take 1 tablet (10 mg total) by mouth daily. Use for ten days  Dispense: 10 tablet; Refill: 0 - Beta hCG quant (ref lab)  Provera 10 mg daily x 10 days and see if she gets her period usually within 7 days.  If not, she will have to do an estrogen-progestin challenge which is Estradiol 2 mg daily x 21 days followed by Provera 10 mg daily x 7 days to see if this results in a period.    Follow  up in 4 weeks if no period for further assessment and estrogen-progestin challenge  Routine preventative health maintenance measures emphasized. Please refer to After Visit Summary for other counseling recommendations.     Lajean Manes, CNM 03/08/19, 10:17 AM

## 2019-03-09 LAB — HEPATITIS B SURFACE ANTIGEN: Hepatitis B Surface Ag: NEGATIVE

## 2019-03-09 LAB — HEPATITIS C ANTIBODY: Hep C Virus Ab: <0.1 s/co ratio (ref 0.0–0.9)

## 2019-03-09 LAB — HIV ANTIBODY (ROUTINE TESTING W REFLEX): HIV Screen 4th Generation wRfx: NONREACTIVE

## 2019-03-09 LAB — RPR: RPR Ser Ql: NONREACTIVE

## 2019-03-11 LAB — PROLACTIN: Prolactin: 7.6 ng/mL (ref 4.8–23.3)

## 2019-03-11 LAB — FOLLICLE STIMULATING HORMONE: FSH: 2 m[IU]/mL

## 2019-03-11 LAB — 17-HYDROXYPROGESTERONE: 17-Hydroxyprogesterone: 167 ng/dL

## 2019-03-11 LAB — TSH: TSH: 1.81 u[IU]/mL (ref 0.450–4.500)

## 2019-03-11 LAB — BETA HCG QUANT (REF LAB): hCG Quant: <1 m[IU]/mL

## 2019-03-13 LAB — CERVICOVAGINAL ANCILLARY ONLY
Bacterial vaginitis: POSITIVE — AB
Candida vaginitis: NEGATIVE
Chlamydia: NEGATIVE
Neisseria Gonorrhea: NEGATIVE
Trichomonas: NEGATIVE

## 2019-04-05 ENCOUNTER — Ambulatory Visit: Payer: BC Managed Care – PPO | Admitting: Certified Nurse Midwife

## 2019-04-05 DIAGNOSIS — N912 Amenorrhea, unspecified: Secondary | ICD-10-CM

## 2019-06-23 ENCOUNTER — Ambulatory Visit: Payer: BC Managed Care – PPO

## 2019-06-27 ENCOUNTER — Ambulatory Visit (INDEPENDENT_AMBULATORY_CARE_PROVIDER_SITE_OTHER): Payer: BC Managed Care – PPO

## 2019-06-27 ENCOUNTER — Other Ambulatory Visit: Payer: Self-pay

## 2019-06-27 VITALS — BP 112/76 | HR 73 | Wt 144.2 lb

## 2019-06-27 DIAGNOSIS — O039 Complete or unspecified spontaneous abortion without complication: Secondary | ICD-10-CM | POA: Diagnosis not present

## 2019-06-27 DIAGNOSIS — Z789 Other specified health status: Secondary | ICD-10-CM | POA: Insufficient documentation

## 2019-06-27 DIAGNOSIS — N911 Secondary amenorrhea: Secondary | ICD-10-CM | POA: Insufficient documentation

## 2019-06-27 NOTE — Patient Instructions (Signed)
Female Infertility  Female infertility refers to a woman's inability to get pregnant (conceive) after a year of having sex regularly (or after 6 months in women over age 29) without using birth control. Infertility can also mean that a woman is not able to carry a pregnancy to full term. Both women and men can have fertility problems. What are the causes? This condition may be caused by:  Problems with reproductive organs. Infertility can result if a woman: ? Has an abnormally short cervix or a cervix that does not remain closed during a pregnancy. ? Has a blockage or scarring in the fallopian tubes. ? Has an abnormally shaped uterus. ? Has uterine fibroids. This is a benign mass of tissue or muscle (tumor) that can develop in the uterus. ? Is not ovulating in a regular way.  Certain medical conditions. These may include: ? Polycystic ovary syndrome (PCOS). This is a hormonal disorder that can cause small cysts to grow on the ovaries. This is the most common cause of infertility in women. ? Endometriosis. This is a condition in which the tissue that lines the uterus (endometrium) grows outside of its normal location. ? Cancer and cancer treatments, such as chemotherapy or radiation. ? Premature ovarian failure. This is when ovaries stop producing eggs and hormones before age 40. ? Sexually transmitted diseases, such as chlamydia or gonorrhea. ? Autoimmune disorders. These are disorders in which the body's defense system (immune system) attacks normal, healthy cells. Infertility can be linked to more than one cause. For some women, the cause of infertility is not known (unexplained infertility). What increases the risk?  Age. A woman's fertility declines with age, especially after her mid-30s.  Being underweight or overweight.  Drinking too much alcohol.  Using drugs such as anabolic steroids, cocaine, and marijuana.  Exercising excessively.  Being exposed to environmental toxins,  such as radiation, pesticides, and certain chemicals. What are the signs or symptoms? The main sign of infertility in women is the inability to get pregnant or carry a pregnancy to full term. How is this diagnosed? This condition may be diagnosed by:  Checking whether you are ovulating each month. The tests may include: ? Blood tests to check hormone levels. ? An ultrasound of the ovaries. ? Taking a small tissue that lines the uterus and checking it under a microscope (endometrial biopsy).  Doing additional tests. This is done if ovulation is normal. Tests may include: ? Hysterosalpingography. This X-ray test can show the shape of the uterus and whether the fallopian tubes are open. ? Laparoscopy. This test uses a lighted tube (laparoscope) to look for problems in the fallopian tubes and other organs. ? Transvaginal ultrasound. This imaging test is used to check for abnormalities in the uterus and ovaries. ? Hysteroscopy. This test uses a lighted tube to check for problems in the cervix and the uterus. To be diagnosed with infertility, both partners will have a physical exam. Both partners will also have an extensive medical and sexual history taken. Additional tests may be done. How is this treated? Treatment depends on the cause of infertility. Most cases of infertility in women are treated with medicine or surgery.  Women may take medicine to: ? Correct ovulation problems. ? Treat other health conditions.  Surgery may be done to: ? Repair damage to the ovaries, fallopian tubes, cervix, or uterus. ? Remove growths from the uterus. ? Remove scar tissue from the uterus, pelvis, or other organs. Assisted reproductive technology (ART) Assisted reproductive technology (  ART) refers to all treatments and procedures that combine eggs and sperm outside the body to try to help a couple conceive. ART is often combined with fertility drugs to stimulate ovulation. Sometimes ART is done using eggs  retrieved from another woman's body (donor eggs) or from previously frozen fertilized eggs (embryos). There are different types of ART. These include:  Intrauterine insemination (IUI). A long, thin tube is used to place sperm directly into a woman's uterus. This procedure: ? Is effective for infertility caused by sperm problems, including low sperm count and low motility. ? Can be used in combination with fertility drugs.  In vitro fertilization (IVF). This is done when a woman's fallopian tubes are blocked or when a man has low sperm count. In this procedure: ? Fertility drugs are used to stimulate the ovaries to produce multiple eggs. ? Once mature, these eggs are removed from the body and combined with the sperm to be fertilized. ? The fertilized eggs are then placed into the woman's uterus. Follow these instructions at home:  Take over-the-counter and prescription medicines only as told by your health care provider.  Do not use any products that contain nicotine or tobacco, such as cigarettes and e-cigarettes. If you need help quitting, ask your health care provider.  If you drink alcohol, limit how much you have to 1 drink a day.  Make dietary changes to lose weight or maintain a healthy weight. Work with your health care provider and a dietitian to set a weight-loss goal that is healthy and reasonable for you.  Seek support from a counselor or support group to talk about your concerns related to infertility. Couples counseling may be helpful for you and your partner.  Practice stress reduction techniques that work well for you, such as regular physical activity, meditation, or deep breathing.  Keep all follow-up visits as told by your health care provider. This is important. Contact a health care provider if you:  Feel that stress is interfering with your life and relationships.  Have side effects from treatments for infertility. Summary  Female infertility refers to a woman's  inability to get pregnant (conceive) after a year of having sex regularly (or after 6 months in women over age 29) without using birth control.  To be diagnosed with infertility, both partners will have a physical exam. Both partners will also have an extensive medical and sexual history taken.  Seek support from a counselor or support group to talk about your concerns related to infertility. Couples counseling may be helpful for you and your partner. This information is not intended to replace advice given to you by your health care provider. Make sure you discuss any questions you have with your health care provider. Document Released: 07/23/2003 Document Revised: 11/10/2018 Document Reviewed: 06/21/2017 Elsevier Patient Education  2020 [employer]  

## 2019-06-27 NOTE — Progress Notes (Signed)
Pt reports she is here today to follow up about a recent miscarriage. Pt reports she had a positive home pregnancy test on 06/06/2019. [facility] the [place] showed she was [redacted] weeks gestation. Pt reports she starting bleeding on 06/18/2019. She went to [facility], they did an [place] and told her that she was having a miscarriage. The patient reports that the [facility] providers followed her HCG to 0. Pt reports she is trying to conceive and wants to follow up.

## 2019-06-27 NOTE — Progress Notes (Signed)
GYNECOLOGY OFFICE VISIT NOTE  History:   Cheyenne Hahn [id number] here today for follow up after miscarriage. Patient reports that she had a miscarriage on November 15th after the onset of bleeding on the 12th.  She reports she had a positive home UPT on Oct 28th and had an ultrasound at [facility] shortly after.  She states she was told she was [redacted] weeks pregnant at the time of the ultrasound. She states she started having light spotting on the 12th that progressed to heavy bleeding with the passing of clots on November 15th resulting in a hospital visit.   Patient states she had a pelvic exam and told she had a miscarriage based on her labs.  However, patient denies passing fetus as she would of been ~12 weeks at that time.  Patient states that she just "wants to know what I need to do to get pregnant and stay pregnant and have my last child."  She denies current bleeding or any concerns including vaginal discharge or pelvic pain.   Past Medical History:  Diagnosis Date  . Anemia   . Anxiety   . Depression   . Pregnancy induced hypertension     Past Surgical History:  Procedure Laterality Date  . WISDOM TOOTH EXTRACTION      The following portions of the patient's history were reviewed and updated as appropriate: allergies, current medications, past family history, past medical history, past social history, past surgical history and problem list.   Health Maintenance:  Normal pap Feb 2019.  No mammogram d/t age.   Review of Systems:  Pertinent items noted in HPI and remainder of comprehensive ROS otherwise negative.    Objective:    Physical Exam BP 112/76   Pulse 73   Wt 144 lb 3.2 oz (65.4 kg)   BMI 25.54 kg/m  Physical Exam Vitals signs and nursing note reviewed.  Constitutional:      Appearance: Normal appearance.  HENT:     Head: Normocephalic and atraumatic.  Eyes:     Conjunctiva/sclera: Conjunctivae normal.  Neck:     Musculoskeletal: Normal range of  motion.  Cardiovascular:     Pulses: Normal pulses.  Pulmonary:     Effort: Pulmonary effort is normal.  Musculoskeletal: Normal range of motion.  Neurological:     Mental Status: She is alert and oriented to person, place, and time.  Psychiatric:        Mood and Affect: Mood normal.        Behavior: Behavior normal.      Labs and Imaging No results found for this or any previous visit (from the past 168 hour(s)). No results found.   Assessment & Plan:      1. Spontaneous abortion -Reviewed [facility] visit from Nov 15th which pelvic exam notes confirm bleeding, but quant less than 1 upon arrival. -Patient informed that after reviewing documents no evidence of pregnancy on file as pregnancy hormone takes time to trend down to zero. -Patient supported and informed that provider does believe she was pregnant but due to lack of continuity of care provider no documentation other than patient report and [facility] ultrasound. -Patient informed of need to sign ROI to obtain [facility] documentation of ultrasound. Patient agreeable and message sent to front office staff for completion of papers.   2. Secondary amenorrhea -Reviewed history of abnormal menses. -Patient reports last menstrual cycle was in July and she had a successful provera challenge in August. -Encouraged to wait for  next cycle before trying to conceive. -Plan implemented that if no cycle in 3 months patient to return to office and see MD for further evaluation and management. Patient agreeable.  3. Attempting to conceive -Extensive discussion regarding continuity of care, with the same practice, for optimal outcomes in pregnancy and overall healthcare. -Reviewed all pre-conception and early pregnancy services that [facility] offer including infertility counseling and management, pregnancy tests, early ultrasounds to confirm dating, and initiation of medications/hormones for those with history of loss. -Patient encouraged to contact  [facility] via appt, phone, or mychart for pre-conception and pregnancy related questions, concerns, and further management.  Routine preventative health maintenance measures emphasized. Please refer to After Visit Summary for other counseling recommendations.   Return in about 3 months (around 09/27/2019) for Follow up for Amenorrhea.      Cherre Robins, CNM 06/27/2019

## 2020-08-01 ENCOUNTER — Ambulatory Visit: Payer: Medicaid Other

## 2020-08-01 DIAGNOSIS — Z3490 Encounter for supervision of normal pregnancy, unspecified, unspecified trimester: Secondary | ICD-10-CM | POA: Insufficient documentation

## 2020-08-01 NOTE — Progress Notes (Signed)
PRENATAL INTAKE SUMMARY  Cheyenne Hahn presents today New OB Nurse Interview.  OB History    Gravida  5   Para  2   Term  2   Preterm  0   AB  2   Living  2     SAB  1   IAB  1   Ectopic  0   Multiple  0   Live Births  2          I have reviewed the patient's medical, obstetrical, social, and family histories, medications, and available lab results.  SUBJECTIVE She has no unusual complaints and complains of nausea with vomiting. Denies Rx management at this time will try natural remedies and BRAT DIET.  OBJECTIVE Initial Physical Exam (New OB)  GENERAL APPEARANCE: Virtual    ASSESSMENT Normal pregnancy  PLAN Prenatal care Pt has Cuff at home Enrolled in [employer] OB Pnl/HIV  OB Urine Culture GC/CT HgbEval SMA CF A1C Glucose  PHQ-9=0   If CHTN - P/C Ratio and CMP

## 2020-08-08 ENCOUNTER — Other Ambulatory Visit (HOSPITAL_COMMUNITY)
Admission: RE | Admit: 2020-08-08 | Discharge: 2020-08-08 | Disposition: A | Discharge status: Home / Self Care | Payer: BLUE CROSS/BLUE SHIELD | Source: Ambulatory Visit | Attending: Obstetrics | Admitting: Obstetrics

## 2020-08-08 ENCOUNTER — Other Ambulatory Visit: Payer: Self-pay

## 2020-08-08 ENCOUNTER — Encounter: Payer: Self-pay | Admitting: Obstetrics

## 2020-08-08 ENCOUNTER — Ambulatory Visit (INDEPENDENT_AMBULATORY_CARE_PROVIDER_SITE_OTHER): Payer: Medicaid Other | Admitting: Obstetrics

## 2020-08-08 VITALS — BP 130/82 | HR 98 | Wt 137.9 lb

## 2020-08-08 DIAGNOSIS — O0991 Supervision of high risk pregnancy, unspecified, first trimester: Secondary | ICD-10-CM | POA: Diagnosis present

## 2020-08-08 DIAGNOSIS — O23591 Infection of other part of genital tract in pregnancy, first trimester: Secondary | ICD-10-CM | POA: Insufficient documentation

## 2020-08-08 DIAGNOSIS — B9689 Other specified bacterial agents as the cause of diseases classified elsewhere: Secondary | ICD-10-CM | POA: Insufficient documentation

## 2020-08-08 DIAGNOSIS — Z3A11 11 weeks gestation of pregnancy: Secondary | ICD-10-CM | POA: Insufficient documentation

## 2020-08-08 DIAGNOSIS — O368130 Decreased fetal movements, third trimester, not applicable or unspecified: Secondary | ICD-10-CM | POA: Diagnosis not present

## 2020-08-08 DIAGNOSIS — Z363 Encounter for antenatal screening for malformations: Secondary | ICD-10-CM | POA: Diagnosis not present

## 2020-08-08 DIAGNOSIS — O99343 Other mental disorders complicating pregnancy, third trimester: Secondary | ICD-10-CM | POA: Diagnosis not present

## 2020-08-08 DIAGNOSIS — O0990 Supervision of high risk pregnancy, unspecified, unspecified trimester: Secondary | ICD-10-CM

## 2020-08-08 DIAGNOSIS — Z148 Genetic carrier of other disease: Secondary | ICD-10-CM | POA: Diagnosis not present

## 2020-08-08 DIAGNOSIS — Z8759 Personal history of other complications of pregnancy, childbirth and the puerperium: Secondary | ICD-10-CM

## 2020-08-08 DIAGNOSIS — Z3A35 35 weeks gestation of pregnancy: Secondary | ICD-10-CM | POA: Diagnosis not present

## 2020-08-08 DIAGNOSIS — O99013 Anemia complicating pregnancy, third trimester: Secondary | ICD-10-CM | POA: Diagnosis not present

## 2020-08-08 DIAGNOSIS — Z3A19 19 weeks gestation of pregnancy: Secondary | ICD-10-CM | POA: Diagnosis not present

## 2020-08-08 MED ORDER — ASPIRIN 81 MG PO CHEW: 81.0000 mg | CHEWABLE_TABLET | Freq: Every day | ORAL | 6 refills | Status: AC

## 2020-08-08 NOTE — Progress Notes (Signed)
Pt presents for NOB provider visit c/o nausea. This is not a planned pregnancy, living with FOB. NOB nurse intake completed 08/01/20 Normal pap Dec 2021 at [facility] per pt

## 2020-08-08 NOTE — Progress Notes (Signed)
Subjective:    Cheyenne Hahn is being seen today for her first obstetrical visit.  This is a planned pregnancy. She is at [redacted]w[redacted]d gestation. Her obstetrical history is significant for pre-eclampsia. Relationship with FOB: spouse, living together. Patient does intend to breast feed. Pregnancy history fully reviewed.  The information documented in the HPI was reviewed and verified.  Menstrual History: OB History    Gravida  6   Para  2   Term  2   Preterm  0   AB  2   Living  2     SAB  1   IAB  1   Ectopic  0   Multiple  0   Live Births  2            Patient's last menstrual period was 05/17/2020.    Past Medical History:  Diagnosis Date  . Anemia   . Anxiety   . Depression   . Pregnancy induced hypertension     Past Surgical History:  Procedure Laterality Date  . WISDOM TOOTH EXTRACTION      (Not in a hospital admission)  No Known Allergies  Social History   Tobacco Use  . Smoking status: Former Smoker    Quit date: 05/02/2012    Years since quitting: 8.2  . Smokeless tobacco: Never Used  . Tobacco comment: 2011  Substance Use Topics  . Alcohol use: Yes    Comment: occ    Family History  Problem Relation Age of Onset  . Hypertension Mother   . Hypertension Maternal Aunt   . Hypertension Maternal Uncle   . Hypertension Maternal Grandmother   . Diabetes Maternal Grandmother      Review of Systems Constitutional: negative for weight loss Gastrointestinal: negative for vomiting Genitourinary:negative for genital lesions and vaginal discharge and dysuria Musculoskeletal:negative for back pain Behavioral/Psych: negative for abusive relationship, depression, illegal drug usage and tobacco use    Objective:    BP 130/82   Pulse 98   Wt 137 lb 14.4 oz (62.6 kg)   LMP 05/17/2020   BMI 24.43 kg/m  General Appearance:    Alert, cooperative, no distress, appears stated age  Head:    Normocephalic, without obvious abnormality, atraumatic   Eyes:    PERRL, conjunctiva/corneas clear, EOM's intact, fundi    benign, both eyes  Ears:    Normal TM's and external ear canals, both ears  Nose:   Nares normal, septum midline, mucosa normal, no drainage    or sinus tenderness  Throat:   Lips, mucosa, and tongue normal; teeth and gums normal  Neck:   Supple, symmetrical, trachea midline, no adenopathy;    thyroid:  no enlargement/tenderness/nodules; no carotid   bruit or JVD  Back:     Symmetric, no curvature, ROM normal, no CVA tenderness  Lungs:     Clear to auscultation bilaterally, respirations unlabored  Chest Wall:    No tenderness or deformity   Heart:    Regular rate and rhythm, S1 and S2 normal, no murmur, rub   or gallop  Breast Exam:    No tenderness, masses, or nipple abnormality  Abdomen:     Soft, non-tender, bowel sounds active all four quadrants,    no masses, no organomegaly  Genitalia:    Normal female without lesion, discharge or tenderness  Extremities:   Extremities normal, atraumatic, no cyanosis or edema  Pulses:   2+ and symmetric all extremities  Skin:   Skin color, texture, turgor  normal, no rashes or lesions  Lymph nodes:   Cervical, supraclavicular, and axillary nodes normal  Neurologic:   CNII-XII intact, normal strength, sensation and reflexes    throughout      Lab Review Urine pregnancy test Labs reviewed yes Radiologic studies reviewed no Assessment:    Pregnancy at [redacted]w[redacted]d weeks    Plan:     1. Supervision of high risk pregnancy, antepartum Rx: - Cervicovaginal ancillary only - Culture, OB Urine - CBC/D/Plt+RPR+Rh+ABO+Rub Ab... - Genetic Screening - Cervicovaginal ancillary only - [place] MFM OB COMP + 14 WK; Future  2. History of pre-eclampsia Rx: - aspirin 81 MG chewable tablet; Chew 1 tablet (81 mg total) by mouth daily.  Dispense: 30 tablet; Refill: 6   Prenatal vitamins.  Counseling provided regarding continued use of seat belts, cessation of alcohol consumption, smoking or use of  illicit drugs; infection precautions i.e., influenza/TDAP immunizations, toxoplasmosis,CMV, parvovirus, listeria and varicella; workplace safety, exercise during pregnancy; routine dental care, safe medications, sexual activity, hot tubs, saunas, pools, travel, caffeine use, fish and methlymercury, potential toxins, hair treatments, varicose veins Weight gain recommendations per IOM guidelines reviewed: underweight/BMI< 18.5--> gain 28 - 40 lbs; normal weight/BMI 18.5 - 24.9--> gain 25 - 35 lbs; overweight/BMI 25 - 29.9--> gain 15 - 25 lbs; obese/BMI >30->gain  11 - 20 lbs Problem list reviewed and updated. FIRST/CF mutation testing/NIPT/QUAD SCREEN/fragile X/Ashkenazi Jewish population testing/Spinal muscular atrophy discussed: requested. Role of ultrasound in pregnancy discussed; fetal survey: requested. Amniocentesis discussed: not indicated.  Meds ordered this encounter  Medications  . aspirin 81 MG chewable tablet    Sig: Chew 1 tablet (81 mg total) by mouth daily.    Dispense:  30 tablet    Refill:  6   Orders Placed This Encounter  Procedures  . Culture, OB Urine  . [place] MFM OB COMP + 14 WK    Standing Status:   Future    Standing Expiration Date:   08/08/2021    Order Specific Question:   Reason for Exam (SYMPTOM  OR DIAGNOSIS REQUIRED)    Answer:   Anatomy    Order Specific Question:   Preferred Location    Answer:   [facility] Ultrasound  . CBC/D/Plt+RPR+Rh+ABO+Rub Ab...  . Genetic Screening    [facility]    Follow up in 4 weeks. 50% of 20 min visit spent on counseling and coordination of care.    Brock Bad, MD 08/08/2020 4:09 PM

## 2020-08-09 LAB — CBC/D/PLT+RPR+RH+ABO+RUB AB...
Antibody Screen: NEGATIVE
Basophils Absolute: 0 10*3/uL (ref 0.0–0.2)
Basos: 0 %
EOS (ABSOLUTE): 0 10*3/uL (ref 0.0–0.4)
Eos: 1 %
HCV Ab: <0.1 s/co ratio (ref 0.0–0.9)
HIV Screen 4th Generation wRfx: NONREACTIVE
Hematocrit: 37.3 % (ref 34.0–46.6)
Hemoglobin: 13 g/dL (ref 11.1–15.9)
Hepatitis B Surface Ag: NEGATIVE
Immature Grans (Abs): 0 10*3/uL (ref 0.0–0.1)
Immature Granulocytes: 0 %
Lymphocytes Absolute: 1.9 10*3/uL (ref 0.7–3.1)
Lymphs: 26 %
MCH: 31 pg (ref 26.6–33.0)
MCHC: 34.9 g/dL (ref 31.5–35.7)
MCV: 89 fL (ref 79–97)
Monocytes Absolute: 0.4 10*3/uL (ref 0.1–0.9)
Monocytes: 6 %
Neutrophils Absolute: 4.8 10*3/uL (ref 1.4–7.0)
Neutrophils: 67 %
Platelets: 286 10*3/uL (ref 150–450)
RBC: 4.19 x10E6/uL (ref 3.77–5.28)
RDW: 12.9 % (ref 11.7–15.4)
RPR Ser Ql: NONREACTIVE
Rh Factor: POSITIVE
Rubella Antibodies, IGG: 5.09 index (ref >0.99)
WBC: 7.2 10*3/uL (ref 3.4–10.8)

## 2020-08-09 LAB — HCV INTERPRETATION

## 2020-08-10 LAB — URINE CULTURE, OB REFLEX

## 2020-08-10 LAB — CULTURE, OB URINE

## 2020-08-13 ENCOUNTER — Other Ambulatory Visit: Payer: Self-pay | Admitting: Obstetrics

## 2020-08-13 DIAGNOSIS — B9689 Other specified bacterial agents as the cause of diseases classified elsewhere: Secondary | ICD-10-CM

## 2020-08-13 LAB — CERVICOVAGINAL ANCILLARY ONLY
Bacterial Vaginitis (gardnerella): POSITIVE — AB
Candida Glabrata: NEGATIVE
Candida Vaginitis: NEGATIVE
Chlamydia: NEGATIVE
Comment: NEGATIVE
Comment: NEGATIVE
Comment: NEGATIVE
Comment: NEGATIVE
Comment: NEGATIVE
Comment: NORMAL
Neisseria Gonorrhea: NEGATIVE
Trichomonas: NEGATIVE

## 2020-08-13 MED ORDER — METRONIDAZOLE 500 MG PO TABS: 500.0000 mg | ORAL_TABLET | Freq: Two times a day (BID) | ORAL | 2 refills | Status: DC

## 2020-08-15 ENCOUNTER — Encounter: Payer: Self-pay | Admitting: Obstetrics

## 2020-08-21 ENCOUNTER — Encounter: Payer: Self-pay | Admitting: Obstetrics

## 2020-08-25 ENCOUNTER — Emergency Department (HOSPITAL_COMMUNITY)
Admission: EM | Admit: 2020-08-25 | Discharge: 2020-08-25 | Discharge status: Absent without leave | Payer: BLUE CROSS/BLUE SHIELD

## 2020-08-25 ENCOUNTER — Emergency Department: Admission: EM | Admit: 2020-08-25 | Discharge: 2020-08-25 | Discharge status: Absent without leave | Payer: Self-pay

## 2020-08-27 ENCOUNTER — Encounter: Payer: Self-pay | Admitting: Obstetrics

## 2020-09-05 ENCOUNTER — Other Ambulatory Visit: Payer: Self-pay

## 2020-09-05 ENCOUNTER — Ambulatory Visit (INDEPENDENT_AMBULATORY_CARE_PROVIDER_SITE_OTHER): Payer: BLUE CROSS/BLUE SHIELD | Admitting: Family Medicine

## 2020-09-05 ENCOUNTER — Encounter: Payer: Self-pay | Admitting: Family Medicine

## 2020-09-05 VITALS — BP 108/73 | HR 130 | Wt 136.6 lb

## 2020-09-05 DIAGNOSIS — F32A Depression, unspecified: Secondary | ICD-10-CM

## 2020-09-05 DIAGNOSIS — Z3480 Encounter for supervision of other normal pregnancy, unspecified trimester: Secondary | ICD-10-CM

## 2020-09-05 MED ORDER — OMEPRAZOLE 20 MG PO CPDR: 20.0000 mg | DELAYED_RELEASE_CAPSULE | Freq: Every day | ORAL | 9 refills | Status: AC

## 2020-09-05 NOTE — Progress Notes (Signed)
wt: 136.6lbs B/p: 108/73 p130

## 2020-09-05 NOTE — Progress Notes (Signed)
Patient reports fetal movement with occasional cramping.

## 2020-09-05 NOTE — Progress Notes (Signed)
   Subjective:  Cheyenne Hahn is a 30 y.o. [phone] at [redacted]w[redacted]d being seen today for ongoing prenatal care.  She is currently monitored for the following issues for this low-risk pregnancy and has Medication overdose; Depression; Acetaminophen overdose of undetermined intent; Gastroesophageal reflux disease; Secondary amenorrhea; Attempting to conceive; and Supervision of normal pregnancy, antepartum on their problem list.  Patient reports heartburn.  Contractions: Irritability. Vag. Bleeding: None.  Movement: Present. Denies leaking of fluid.   The following portions of the patient's history were reviewed and updated as appropriate: allergies, current medications, past family history, past medical history, past social history, past surgical history and problem list. Problem list updated.  Objective:   Vitals:   09/05/20 1019  BP: 108/73  Pulse: (!) 130  Weight: 136 lb 9.6 oz (62 kg)    Fetal Status: Fetal Heart Rate (bpm): 156   Movement: Present     General:  Alert, oriented and cooperative. Patient is in no acute distress.  Skin: Skin is warm and dry. No rash noted.   Cardiovascular: Normal heart rate noted  Respiratory: Normal respiratory effort, no problems with respiration noted  Abdomen: Soft, gravid, appropriate for gestational age. Pain/Pressure: Present     Pelvic: Vag. Bleeding: None     Cervical exam deferred        Extremities: Normal range of motion.  Edema: None  Mental Status: Normal mood and affect. Normal behavior. Normal judgment and thought content.   Urinalysis:      Assessment and Plan:  Pregnancy: [id number] at [redacted]w[redacted]d  1. Supervision of other normal pregnancy, antepartum FHR and BP normal Omeprazole for heartburn Genetic screening results discussed, not planning to pursue partner testing for SMA given low risk  2. Depression, unspecified depression type Mood stable  Preterm labor symptoms and general obstetric precautions including but not limited to  vaginal bleeding, contractions, leaking of fluid and fetal movement were reviewed in detail with the patient. Please refer to After Visit Summary for other counseling recommendations.  Return in 4 weeks (on 10/03/2020) for [facility], ob visit.   Venora Maples, MD

## 2020-09-05 NOTE — Patient Instructions (Signed)
 Contraception Choices Contraception, also called birth control, refers to methods or devices that prevent pregnancy. Hormonal methods Contraceptive implant A contraceptive implant is a thin, plastic tube that contains a hormone that prevents pregnancy. It is different from an intrauterine device (IUD). It is inserted into the upper part of the arm by a health care provider. Implants can be effective for up to 3 years. Progestin-only injections Progestin-only injections are injections of progestin, a synthetic form of the hormone progesterone. They are given every 3 months by a health care provider. Birth control pills Birth control pills are pills that contain hormones that prevent pregnancy. They must be taken once a day, preferably at the same time each day. A prescription is needed to use this method of contraception. Birth control patch The birth control patch contains hormones that prevent pregnancy. It is placed on the skin and must be changed once a week for three weeks and removed on the fourth week. A prescription is needed to use this method of contraception. Vaginal ring A vaginal ring contains hormones that prevent pregnancy. It is placed in the vagina for three weeks and removed on the fourth week. After that, the process is repeated with a new ring. A prescription is needed to use this method of contraception. Emergency contraceptive Emergency contraceptives prevent pregnancy after unprotected sex. They come in pill form and can be taken up to 5 days after sex. They work best the sooner they are taken after having sex. Most emergency contraceptives are available without a prescription. This method should not be used as your only form of birth control.   Barrier methods Female condom A female condom is a thin sheath that is worn over the penis during sex. Condoms keep sperm from going inside a woman's body. They can be used with a sperm-killing substance (spermicide) to increase their  effectiveness. They should be thrown away after one use. Female condom A female condom is a soft, loose-fitting sheath that is put into the vagina before sex. The condom keeps sperm from going inside a woman's body. They should be thrown away after one use. Diaphragm A diaphragm is a soft, dome-shaped barrier. It is inserted into the vagina before sex, along with a spermicide. The diaphragm blocks sperm from entering the uterus, and the spermicide kills sperm. A diaphragm should be left in the vagina for 6-8 hours after sex and removed within 24 hours. A diaphragm is prescribed and fitted by a health care provider. A diaphragm should be replaced every 1-2 years, after giving birth, after gaining more than 15 lb (6.8 kg), and after pelvic surgery. Cervical cap A cervical cap is a round, soft latex or plastic cup that fits over the cervix. It is inserted into the vagina before sex, along with spermicide. It blocks sperm from entering the uterus. The cap should be left in place for 6-8 hours after sex and removed within 48 hours. A cervical cap must be prescribed and fitted by a health care provider. It should be replaced every 2 years. Sponge A sponge is a soft, circular piece of polyurethane foam with spermicide in it. The sponge helps block sperm from entering the uterus, and the spermicide kills sperm. To use it, you make it wet and then insert it into the vagina. It should be inserted before sex, left in for at least 6 hours after sex, and removed and thrown away within 30 hours. Spermicides Spermicides are chemicals that kill or block sperm from entering the   cervix and uterus. They can come as a cream, jelly, suppository, foam, or tablet. A spermicide should be inserted into the vagina with an applicator at least 10-15 minutes before sex to allow time for it to work. The process must be repeated every time you have sex. Spermicides do not require a prescription.   Intrauterine  contraception Intrauterine device (IUD) An IUD is a T-shaped device that is put in a woman's uterus. There are two types:  Hormone IUD.This type contains progestin, a synthetic form of the hormone progesterone. This type can stay in place for 3-5 years.  Copper IUD.This type is wrapped in copper wire. It can stay in place for 10 years. Permanent methods of contraception Female tubal ligation In this method, a woman's fallopian tubes are sealed, tied, or blocked during surgery to prevent eggs from traveling to the uterus. Hysteroscopic sterilization In this method, a small, flexible insert is placed into each fallopian tube. The inserts cause scar tissue to form in the fallopian tubes and block them, so sperm cannot reach an egg. The procedure takes about 3 months to be effective. Another form of birth control must be used during those 3 months. Female sterilization This is a procedure to tie off the tubes that carry sperm (vasectomy). After the procedure, the man can still ejaculate fluid (semen). Another form of birth control must be used for 3 months after the procedure. Natural planning methods Natural family planning In this method, a couple does not have sex on days when the woman could become pregnant. Calendar method In this method, the woman keeps track of the length of each menstrual cycle, identifies the days when pregnancy can happen, and does not have sex on those days. Ovulation method In this method, a couple avoids sex during ovulation. Symptothermal method This method involves not having sex during ovulation. The woman typically checks for ovulation by watching changes in her temperature and in the consistency of cervical mucus. Post-ovulation method In this method, a couple waits to have sex until after ovulation. Where to find more information  Centers for Disease Control and Prevention: www.cdc.gov Summary  Contraception, also called birth control, refers to methods or  devices that prevent pregnancy.  Hormonal methods of contraception include implants, injections, pills, patches, vaginal rings, and emergency contraceptives.  Barrier methods of contraception can include female condoms, female condoms, diaphragms, cervical caps, sponges, and spermicides.  There are two types of IUDs (intrauterine devices). An IUD can be put in a woman's uterus to prevent pregnancy for 3-5 years.  Permanent sterilization can be done through a procedure for males and females. Natural family planning methods involve nothaving sex on days when the woman could become pregnant. This information is not intended to replace advice given to you by your health care provider. Make sure you discuss any questions you have with your health care provider. Document Revised: 12/25/2019 Document Reviewed: 12/25/2019 Elsevier Patient Education  2021 [employer] Inc.   Breastfeeding  Choosing to breastfeed is one of the best decisions you can make for yourself and your baby. A change in hormones during pregnancy causes your breasts to make breast milk in your milk-producing glands. Hormones prevent breast milk from being released before your baby is born. They also prompt milk flow after birth. Once breastfeeding has begun, thoughts of your baby, as well as his or her sucking or crying, can stimulate the release of milk from your milk-producing glands. Benefits of breastfeeding Research shows that breastfeeding offers many health benefits   for infants and mothers. It also offers a cost-free and convenient way to feed your baby. For your baby  Your first milk (colostrum) helps your baby's digestive system to function better.  Special cells in your milk (antibodies) help your baby to fight off infections.  Breastfed babies are less likely to develop asthma, allergies, obesity, or type 2 diabetes. They are also at lower risk for sudden infant death syndrome (SIDS).  Nutrients in breast milk are better  able to meet your baby's needs compared to infant formula.  Breast milk improves your baby's brain development. For you  Breastfeeding helps to create a very special bond between you and your baby.  Breastfeeding is convenient. Breast milk costs nothing and is always available at the correct temperature.  Breastfeeding helps to burn calories. It helps you to lose the weight that you gained during pregnancy.  Breastfeeding makes your uterus return faster to its size before pregnancy. It also slows bleeding (lochia) after you give birth.  Breastfeeding helps to lower your risk of developing type 2 diabetes, osteoporosis, rheumatoid arthritis, cardiovascular disease, and breast, ovarian, uterine, and endometrial cancer later in life. Breastfeeding basics Starting breastfeeding  Find a comfortable place to sit or lie down, with your neck and back well-supported.  Place a pillow or a rolled-up blanket under your baby to bring him or her to the level of your breast (if you are seated). Nursing pillows are specially designed to help support your arms and your baby while you breastfeed.  Make sure that your baby's tummy (abdomen) is facing your abdomen.  Gently massage your breast. With your fingertips, massage from the outer edges of your breast inward toward the nipple. This encourages milk flow. If your milk flows slowly, you may need to continue this action during the feeding.  Support your breast with 4 fingers underneath and your thumb above your nipple (make the letter "C" with your hand). Make sure your fingers are well away from your nipple and your baby's mouth.  Stroke your baby's lips gently with your finger or nipple.  When your baby's mouth is open wide enough, quickly bring your baby to your breast, placing your entire nipple and as much of the areola as possible into your baby's mouth. The areola is the colored area around your nipple. ? More areola should be visible above your  baby's upper lip than below the lower lip. ? Your baby's lips should be opened and extended outward (flanged) to ensure an adequate, comfortable latch. ? Your baby's tongue should be between his or her lower gum and your breast.  Make sure that your baby's mouth is correctly positioned around your nipple (latched). Your baby's lips should create a seal on your breast and be turned out (everted).  It is common for your baby to suck about 2-3 minutes in order to start the flow of breast milk. Latching Teaching your baby how to latch onto your breast properly is very important. An improper latch can cause nipple pain, decreased milk supply, and poor weight gain in your baby. Also, if your baby is not latched onto your nipple properly, he or she may swallow some air during feeding. This can make your baby fussy. Burping your baby when you switch breasts during the feeding can help to get rid of the air. However, teaching your baby to latch on properly is still the best way to prevent fussiness from swallowing air while breastfeeding. Signs that your baby has successfully latched onto   your nipple  Silent tugging or silent sucking, without causing you pain. Infant's lips should be extended outward (flanged).  Swallowing heard between every 3-4 sucks once your milk has started to flow (after your let-down milk reflex occurs).  Muscle movement above and in front of his or her ears while sucking. Signs that your baby has not successfully latched onto your nipple  Sucking sounds or smacking sounds from your baby while breastfeeding.  Nipple pain. If you think your baby has not latched on correctly, slip your finger into the corner of your baby's mouth to break the suction and place it between your baby's gums. Attempt to start breastfeeding again. Signs of successful breastfeeding Signs from your baby  Your baby will gradually decrease the number of sucks or will completely stop sucking.  Your baby  will fall asleep.  Your baby's body will relax.  Your baby will retain a small amount of milk in his or her mouth.  Your baby will let go of your breast by himself or herself. Signs from you  Breasts that have increased in firmness, weight, and size 1-3 hours after feeding.  Breasts that are softer immediately after breastfeeding.  Increased milk volume, as well as a change in milk consistency and color by the fifth day of breastfeeding.  Nipples that are not sore, cracked, or bleeding. Signs that your baby is getting enough milk  Wetting at least 1-2 diapers during the first 24 hours after birth.  Wetting at least 5-6 diapers every 24 hours for the first week after birth. The urine should be clear or pale yellow by the age of 5 days.  Wetting 6-8 diapers every 24 hours as your baby continues to grow and develop.  At least 3 stools in a 24-hour period by the age of 5 days. The stool should be soft and yellow.  At least 3 stools in a 24-hour period by the age of 7 days. The stool should be seedy and yellow.  No loss of weight greater than 10% of birth weight during the first 3 days of life.  Average weight gain of 4-7 oz (113-198 g) per week after the age of 4 days.  Consistent daily weight gain by the age of 5 days, without weight loss after the age of 2 weeks. After a feeding, your baby may spit up a small amount of milk. This is normal. Breastfeeding frequency and duration Frequent feeding will help you make more milk and can prevent sore nipples and extremely full breasts (breast engorgement). Breastfeed when you feel the need to reduce the fullness of your breasts or when your baby shows signs of hunger. This is called "breastfeeding on demand." Signs that your baby is hungry include:  Increased alertness, activity, or restlessness.  Movement of the head from side to side.  Opening of the mouth when the corner of the mouth or cheek is stroked (rooting).  Increased  sucking sounds, smacking lips, cooing, sighing, or squeaking.  Hand-to-mouth movements and sucking on fingers or hands.  Fussing or crying. Avoid introducing a pacifier to your baby in the first 4-6 weeks after your baby is born. After this time, you may choose to use a pacifier. Research has shown that pacifier use during the first year of a baby's life decreases the risk of sudden infant death syndrome (SIDS). Allow your baby to feed on each breast as long as he or she wants. When your baby unlatches or falls asleep while feeding from the   first breast, offer the second breast. Because newborns are often sleepy in the first few weeks of life, you may need to awaken your baby to get him or her to feed. Breastfeeding times will vary from baby to baby. However, the following rules can serve as a guide to help you make sure that your baby is properly fed:  Newborns (babies 4 weeks of age or younger) may breastfeed every 1-3 hours.  Newborns should not go without breastfeeding for longer than 3 hours during the day or 5 hours during the night.  You should breastfeed your baby a minimum of 8 times in a 24-hour period. Breast milk pumping Pumping and storing breast milk allows you to make sure that your baby is exclusively fed your breast milk, even at times when you are unable to breastfeed. This is especially important if you go back to work while you are still breastfeeding, or if you are not able to be present during feedings. Your lactation consultant can help you find a method of pumping that works best for you and give you guidelines about how long it is safe to store breast milk.      Caring for your breasts while you breastfeed Nipples can become dry, cracked, and sore while breastfeeding. The following recommendations can help keep your breasts moisturized and healthy:  Avoid using soap on your nipples.  Wear a supportive bra designed especially for nursing. Avoid wearing underwire-style  bras or extremely tight bras (sports bras).  Air-dry your nipples for 3-4 minutes after each feeding.  Use only cotton bra pads to absorb leaked breast milk. Leaking of breast milk between feedings is normal.  Use lanolin on your nipples after breastfeeding. Lanolin helps to maintain your skin's normal moisture barrier. Pure lanolin is not harmful (not toxic) to your baby. You may also hand express a few drops of breast milk and gently massage that milk into your nipples and allow the milk to air-dry. In the first few weeks after giving birth, some women experience breast engorgement. Engorgement can make your breasts feel heavy, warm, and tender to the touch. Engorgement peaks within 3-5 days after you give birth. The following recommendations can help to ease engorgement:  Completely empty your breasts while breastfeeding or pumping. You may want to start by applying warm, moist heat (in the shower or with warm, water-soaked hand towels) just before feeding or pumping. This increases circulation and helps the milk flow. If your baby does not completely empty your breasts while breastfeeding, pump any extra milk after he or she is finished.  Apply ice packs to your breasts immediately after breastfeeding or pumping, unless this is too uncomfortable for you. To do this: ? Put ice in a plastic bag. ? Place a towel between your skin and the bag. ? Leave the ice on for 20 minutes, 2-3 times a day.  Make sure that your baby is latched on and positioned properly while breastfeeding. If engorgement persists after 48 hours of following these recommendations, contact your health care provider or a lactation consultant. Overall health care recommendations while breastfeeding  Eat 3 healthy meals and 3 snacks every day. Well-nourished mothers who are breastfeeding need an additional 450-500 calories a day. You can meet this requirement by increasing the amount of a balanced diet that you eat.  Drink  enough water to keep your urine pale yellow or clear.  Rest often, relax, and continue to take your prenatal vitamins to prevent fatigue, stress, and low   vitamin and mineral levels in your body (nutrient deficiencies).  Do not use any products that contain nicotine or tobacco, such as cigarettes and e-cigarettes. Your baby may be harmed by chemicals from cigarettes that pass into breast milk and exposure to secondhand smoke. If you need help quitting, ask your health care provider.  Avoid alcohol.  Do not use illegal drugs or marijuana.  Talk with your health care provider before taking any medicines. These include over-the-counter and prescription medicines as well as vitamins and herbal supplements. Some medicines that may be harmful to your baby can pass through breast milk.  It is possible to become pregnant while breastfeeding. If birth control is desired, ask your health care provider about options that will be safe while breastfeeding your baby. Where to find more information: [employer]: www.llli.org Contact a health care provider if:  You feel like you want to stop breastfeeding or have become frustrated with breastfeeding.  Your nipples are cracked or bleeding.  Your breasts are red, tender, or warm.  You have: ? Painful breasts or nipples. ? A swollen area on either breast. ? A fever or chills. ? Nausea or vomiting. ? Drainage other than breast milk from your nipples.  Your breasts do not become full before feedings by the fifth day after you give birth.  You feel sad and depressed.  Your baby is: ? Too sleepy to eat well. ? Having trouble sleeping. ? More than 1 week old and wetting fewer than 6 diapers in a 24-hour period. ? Not gaining weight by 5 days of age.  Your baby has fewer than 3 stools in a 24-hour period.  Your baby's skin or the white parts of his or her eyes become yellow. Get help right away if:  Your baby is overly tired  (lethargic) and does not want to wake up and feed.  Your baby develops an unexplained fever. Summary  Breastfeeding offers many health benefits for infant and mothers.  Try to breastfeed your infant when he or she shows early signs of hunger.  Gently tickle or stroke your baby's lips with your finger or nipple to allow the baby to open his or her mouth. Bring the baby to your breast. Make sure that much of the areola is in your baby's mouth. Offer one side and burp the baby before you offer the other side.  Talk with your health care provider or lactation consultant if you have questions or you face problems as you breastfeed. This information is not intended to replace advice given to you by your health care provider. Make sure you discuss any questions you have with your health care provider. Document Revised: 10/14/2017 Document Reviewed: 08/21/2016 Elsevier Patient Education  2021 Elsevier Inc.  

## 2020-09-11 ENCOUNTER — Encounter: Payer: Self-pay | Admitting: *Deleted

## 2020-10-03 ENCOUNTER — Other Ambulatory Visit: Payer: Self-pay | Admitting: Obstetrics

## 2020-10-03 ENCOUNTER — Encounter: Payer: BLUE CROSS/BLUE SHIELD | Admitting: Obstetrics and Gynecology

## 2020-10-03 ENCOUNTER — Other Ambulatory Visit: Payer: Self-pay

## 2020-10-03 ENCOUNTER — Ambulatory Visit: Payer: BLUE CROSS/BLUE SHIELD | Attending: Obstetrics

## 2020-10-03 DIAGNOSIS — Z3A19 19 weeks gestation of pregnancy: Secondary | ICD-10-CM | POA: Diagnosis not present

## 2020-10-03 DIAGNOSIS — Z363 Encounter for antenatal screening for malformations: Secondary | ICD-10-CM

## 2020-10-03 DIAGNOSIS — O0990 Supervision of high risk pregnancy, unspecified, unspecified trimester: Secondary | ICD-10-CM | POA: Diagnosis present

## 2020-10-03 DIAGNOSIS — Z148 Genetic carrier of other disease: Secondary | ICD-10-CM | POA: Diagnosis not present

## 2020-10-07 ENCOUNTER — Ambulatory Visit (INDEPENDENT_AMBULATORY_CARE_PROVIDER_SITE_OTHER): Payer: BLUE CROSS/BLUE SHIELD | Admitting: Obstetrics & Gynecology

## 2020-10-07 ENCOUNTER — Other Ambulatory Visit: Payer: Self-pay

## 2020-10-07 VITALS — BP 110/71 | HR 101 | Wt 145.0 lb

## 2020-10-07 DIAGNOSIS — Z3480 Encounter for supervision of other normal pregnancy, unspecified trimester: Secondary | ICD-10-CM

## 2020-10-07 NOTE — Progress Notes (Signed)
+   Fetal movement. Pt states she is experiencing increased round ligament pain. Flexeril helps but makes her sleepy.

## 2020-10-07 NOTE — Patient Instructions (Signed)

## 2020-10-07 NOTE — Progress Notes (Signed)
   PRENATAL VISIT NOTE  Subjective:  Cheyenne Hahn is a 30 y.o. [id number] at [redacted]w[redacted]d being seen today for ongoing prenatal care.  She is currently monitored for the following issues for this low-risk pregnancy and has Depression; Gastroesophageal reflux disease; and Supervision of normal pregnancy, antepartum on their problem list.  Patient reports leg pressure with extended standing at work.  Contractions: Not present. Vag. Bleeding: None.  Movement: Present. Denies leaking of fluid.   The following portions of the patient's history were reviewed and updated as appropriate: allergies, current medications, past family history, past medical history, past social history, past surgical history and problem list.   Objective:   Vitals:   10/07/20 1607  BP: 110/71  Pulse: (!) 101  Weight: 145 lb (65.8 kg)    Fetal Status: Fetal Heart Rate (bpm): 150   Movement: Present     General:  Alert, oriented and cooperative. Patient is in no acute distress.  Skin: Skin is warm and dry. No rash noted.   Cardiovascular: Normal heart rate noted  Respiratory: Normal respiratory effort, no problems with respiration noted  Abdomen: Soft, gravid, appropriate for gestational age.  Pain/Pressure: Present     Pelvic: Cervical exam deferred        Extremities: Normal range of motion.  Edema: None  Mental Status: Normal mood and affect. Normal behavior. Normal judgment and thought content.   Assessment and Plan:  Pregnancy: [id number] at [redacted]w[redacted]d 1. Supervision of other normal pregnancy, antepartum screening - AFP, Serum, Open Spina Bifida Suggest compression hose at work Preterm labor symptoms and general obstetric precautions including but not limited to vaginal bleeding, contractions, leaking of fluid and fetal movement were reviewed in detail with the patient. Please refer to After Visit Summary for other counseling recommendations.   Return in about 4 weeks (around 11/04/2020).  Future Appointments  Date  Time Provider Department Center  10/14/2020  2:30 PM [facility]    Scheryl Darter, MD

## 2020-10-09 LAB — AFP, SERUM, OPEN SPINA BIFIDA
AFP MoM: 1.08
AFP Value: 72 ng/mL
Gest. Age on Collection Date: 20.3 weeks
Maternal Age At EDD: 31.4 yr
OSBR Risk 1 IN: 10000
Test Results:: NEGATIVE
Weight: 145 [lb_av]

## 2020-10-14 ENCOUNTER — Ambulatory Visit: Payer: BLUE CROSS/BLUE SHIELD

## 2020-10-28 ENCOUNTER — Telehealth: Payer: Self-pay

## 2020-10-28 NOTE — Telephone Encounter (Signed)
Pt sent an image of her blood pressure machine reading 196/139. I called patient to recheck BP appropriately and assess her sx's. Repeat BP 178/126, pt reports swelling in face and feet Hx of pre-eclampsia Consulted with provider, pt to report to [facility] per Dr. Jolayne Panther.  Pt agrees and has no further questions.

## 2020-10-29 DIAGNOSIS — M5489 Other dorsalgia: Secondary | ICD-10-CM | POA: Diagnosis not present

## 2020-11-04 ENCOUNTER — Other Ambulatory Visit: Payer: Self-pay

## 2020-11-04 ENCOUNTER — Ambulatory Visit (INDEPENDENT_AMBULATORY_CARE_PROVIDER_SITE_OTHER): Payer: BLUE CROSS/BLUE SHIELD | Admitting: Obstetrics and Gynecology

## 2020-11-04 ENCOUNTER — Ambulatory Visit (INDEPENDENT_AMBULATORY_CARE_PROVIDER_SITE_OTHER): Payer: BLUE CROSS/BLUE SHIELD | Admitting: Licensed Clinical Social Worker

## 2020-11-04 ENCOUNTER — Encounter: Payer: Self-pay | Admitting: Obstetrics and Gynecology

## 2020-11-04 VITALS — BP 117/72 | HR 94 | Wt 147.0 lb

## 2020-11-04 DIAGNOSIS — O99340 Other mental disorders complicating pregnancy, unspecified trimester: Secondary | ICD-10-CM | POA: Diagnosis not present

## 2020-11-04 DIAGNOSIS — F32A Depression, unspecified: Secondary | ICD-10-CM | POA: Diagnosis not present

## 2020-11-04 DIAGNOSIS — Z3480 Encounter for supervision of other normal pregnancy, unspecified trimester: Secondary | ICD-10-CM

## 2020-11-04 NOTE — Progress Notes (Signed)
   PRENATAL VISIT NOTE  Subjective:  Cheyenne Hahn is a 30 y.o. [phone] at [redacted]w[redacted]d being seen today for ongoing prenatal care.  She is currently monitored for the following issues for this low-risk pregnancy and has Depression; Gastroesophageal reflux disease; and Supervision of normal pregnancy, antepartum on their problem list.  Patient reports no complaints.  Contractions: Not present. Vag. Bleeding: None.  Movement: Present. Denies leaking of fluid.   The following portions of the patient's history were reviewed and updated as appropriate: allergies, current medications, past family history, past medical history, past social history, past surgical history and problem list.   Objective:   Vitals:   11/04/20 0955  BP: 117/72  Pulse: 94  Weight: 147 lb (66.7 kg)    Fetal Status: Fetal Heart Rate (bpm): 152 Fundal Height: 25 cm Movement: Present     General:  Alert, oriented and cooperative. Patient is in no acute distress.  Skin: Skin is warm and dry. No rash noted.   Cardiovascular: Normal heart rate noted  Respiratory: Normal respiratory effort, no problems with respiration noted  Abdomen: Soft, gravid, appropriate for gestational age.  Pain/Pressure: Present     Pelvic: Cervical exam deferred        Extremities: Normal range of motion.  Edema: Trace  Mental Status: Normal mood and affect. Normal behavior. Normal judgment and thought content.   Assessment and Plan:  Pregnancy: [id number] at [redacted]w[redacted]d 1. Supervision of other normal pregnancy, antepartum Patient is doing well without complaints Third trimester labs with glucola next visit  2. Depression, unspecified depression type Patient currently not on any medication but states that she is starting to feel depressive symptoms- crying more, more anxious as delivery date approaches Patient agreed to see integrated behavioral health today  Preterm labor symptoms and general obstetric precautions including but not limited to  vaginal bleeding, contractions, leaking of fluid and fetal movement were reviewed in detail with the patient. Please refer to After Visit Summary for other counseling recommendations.   Return in about 4 weeks (around 12/02/2020) for in person, ROB, Low risk, 2 hr glucola next visit.  No future appointments.  Catalina Antigua, MD

## 2020-11-07 NOTE — BH Specialist Note (Signed)
Integrated Behavioral Health Initial In-Person Visit  MRN: [id number] Name: Cheyenne Hahn  Number of Integrated Behavioral Health Clinician visits:: 1/6 Session Start time: 10:15am  Session End time: 10:30am Total time: 15 minutes in person at [facility]   Types of Service: Individual psychotherapy  Interpretor:No. Interpretor Name and Language: none   Warm Hand Off Completed.       Subjective: Cheyenne Hahn is a 30 y.o. female accompanied by n/a Patient was referred by Cheyenne Bode MD for depression. Patient reports the following symptoms/concerns: depressed mood, fatigue, and irritability  Duration of problem: over one year ; Severity of problem: mild  Objective: Mood: Depressed and Affect: Appropriate Risk of harm to self or others: No plan to harm self or others  Life Context: Family and Social: Lives with father of children in [place]  School/Work: n/a Self-Care: n/a Life Changes: n/a  Patient and/or Family's Strengths/Protective Factors: Concrete supports in place (healthy food, safe environments, etc.)  Goals Addressed: Patient will: 1. Reduce symptoms of: depression 2. Increase knowledge and/or ability of: coping skills  3. Demonstrate ability to: Increase healthy adjustment to current life circumstances  Progress towards Goals: Ongoing  Interventions: Interventions utilized: Supportive Counseling  Standardized Assessments completed: n/a  Assessment: Patient currently experiencing depression affecting pregnancy    Patient may benefit from integrated behavioral health   Plan: 1. Follow up with behavioral health clinician on : 12/02/2020 2. Behavioral recommendations: engage in mindfulness, stress reducing activity and journal to identify triggers and promote emotion regulation 3. Referral(s): n/a 4. "From scale of 1-10, how likely are you to follow plan?":   Cheyenne Saxon, LCSW

## 2020-12-02 ENCOUNTER — Other Ambulatory Visit: Payer: Self-pay

## 2020-12-02 ENCOUNTER — Other Ambulatory Visit: Payer: BLUE CROSS/BLUE SHIELD

## 2020-12-02 ENCOUNTER — Encounter: Payer: Self-pay | Admitting: Advanced Practice Midwife

## 2020-12-02 ENCOUNTER — Ambulatory Visit (INDEPENDENT_AMBULATORY_CARE_PROVIDER_SITE_OTHER): Payer: BLUE CROSS/BLUE SHIELD | Admitting: Advanced Practice Midwife

## 2020-12-02 ENCOUNTER — Ambulatory Visit (INDEPENDENT_AMBULATORY_CARE_PROVIDER_SITE_OTHER): Payer: BLUE CROSS/BLUE SHIELD | Admitting: Licensed Clinical Social Worker

## 2020-12-02 VITALS — BP 112/68 | HR 91 | Wt 147.0 lb

## 2020-12-02 DIAGNOSIS — O99340 Other mental disorders complicating pregnancy, unspecified trimester: Secondary | ICD-10-CM

## 2020-12-02 DIAGNOSIS — M549 Dorsalgia, unspecified: Secondary | ICD-10-CM

## 2020-12-02 DIAGNOSIS — O99013 Anemia complicating pregnancy, third trimester: Secondary | ICD-10-CM

## 2020-12-02 DIAGNOSIS — F32A Depression, unspecified: Secondary | ICD-10-CM

## 2020-12-02 DIAGNOSIS — Z3A28 28 weeks gestation of pregnancy: Secondary | ICD-10-CM

## 2020-12-02 DIAGNOSIS — Z3480 Encounter for supervision of other normal pregnancy, unspecified trimester: Secondary | ICD-10-CM

## 2020-12-02 DIAGNOSIS — O99891 Other specified diseases and conditions complicating pregnancy: Secondary | ICD-10-CM

## 2020-12-02 LAB — POCT URINALYSIS DIPSTICK
Bilirubin, UA: NEGATIVE
Blood, UA: NEGATIVE
Glucose, UA: NEGATIVE
Ketones, UA: NEGATIVE
Leukocytes, UA: NEGATIVE
Nitrite, UA: NEGATIVE
Protein, UA: NEGATIVE
Spec Grav, UA: 1.02 (ref 1.010–1.025)
Urobilinogen, UA: 0.2 E.U./dL
pH, UA: 6 (ref 5.0–8.0)

## 2020-12-02 MED ORDER — CYCLOBENZAPRINE HCL 5 MG PO TABS
5.0000 mg | ORAL_TABLET | Freq: Three times a day (TID) | ORAL | 0 refills | Status: AC | PRN
Start: 2020-12-02 — End: ?

## 2020-12-02 NOTE — BH Specialist Note (Signed)
Integrated Behavioral Health Follow Up In-Person Visit  MRN: [id number] Name: Cheyenne Hahn  Number of Integrated Behavioral Health Clinician visits: 1/6 Session Start time: 9:36am Session End time: 10:06am Total time: 30 mins in person at [facility]   Types of Service: Individual psychotherapy  Interpretor:No  Interpretor Name and Language: None   Subjective: Cheyenne Hahn is a 30 y.o. female accompanied by n/a Patient was referred by L. Leftwich Craige Cotta for anxiety Patient reports the following symptoms/concerns: anxious mood, worry,  Duration of problem:over 6 months  ; Severity of problem: mild  Objective: Mood: mood  and Affect: appropriate  Risk of harm to self or others: No risk of harm to self or others   Life Context: Family and Social: Lives with FOB and two daughters  School/Work: n/a Self-Care: Spending time with children  Life Changes: New pregnancy   Patient and/or Family's Strengths/Protective Factors: Secured connections in place such as supportive partner and stable housing.   Goals Addressed: Patient will: 1.  Reduce symptoms of:  Depression  2.  Increase knowledge of diagnosis and demonstrated coping strategies to alleviate symptoms  3.  Demonstrate ability to: self manage symptoms   Progress towards Goals: Ongoing   Interventions: Interventions utilized:  Supportive counseling  Standardized Assessments completed:  Flowsheet Row Routine Prenatal from [facility]/02/2020 [facility]  PHQ-9 Total Score 6     Assessment: Patient currently experiencing depression affecting pregnancy   Patient may benefit from integrated behavioral health. Ms. Frisbee reports improvement with mood and relationship father of baby. Ms. Yera reports positive supports, safe and secured housing. Ms. Kilburg denies food insecurity and domestic violence  Plan: 1. Follow up with behavioral health clinician on : 3 weeks  2. Behavioral recommendations:  Continue journal writing and stress reducing activity. Communicate needs with partner to prevent burnout and increase positive relationship goals  3. Referral(s): integrated behavioral health  4. "From scale of 1-10, how likely are you to follow plan?":  [name], LCSW

## 2020-12-02 NOTE — Progress Notes (Signed)
   PRENATAL VISIT NOTE  Subjective:  Cheyenne Hahn is a 30 y.o. [phone] at 104w3d being seen today for ongoing prenatal care.  She is currently monitored for the following issues for this low-risk pregnancy and has Depression; Gastroesophageal reflux disease; and Supervision of normal pregnancy, antepartum on their problem list.  Patient reports backache and occasional contractions.  Contractions: Irritability. Vag. Bleeding: None.  Movement: Present. Denies leaking of fluid.   The following portions of the patient's history were reviewed and updated as appropriate: allergies, current medications, past family history, past medical history, past social history, past surgical history and problem list.   Objective:   Vitals:   12/02/20 0914  BP: 112/68  Pulse: 91  Weight: 147 lb (66.7 kg)    Fetal Status: Fetal Heart Rate (bpm): 140 Fundal Height: 28 cm Movement: Present     General:  Alert, oriented and cooperative. Patient is in no acute distress.  Skin: Skin is warm and dry. No rash noted.   Cardiovascular: Normal heart rate noted  Respiratory: Normal respiratory effort, no problems with respiration noted  Abdomen: Soft, gravid, appropriate for gestational age.  Pain/Pressure: Present     Pelvic: Cervical exam performed in the presence of a chaperone Dilation: Closed Effacement (%): 0 Station: Ballotable  Extremities: Normal range of motion.  Edema: Trace  Mental Status: Normal mood and affect. Normal behavior. Normal judgment and thought content.   Assessment and Plan:  Pregnancy: [id number] at [redacted]w[redacted]d 1. [redacted] weeks gestation of pregnancy  - Glucose Tolerance, 2 Hours w/1 Hour - CBC - HIV Antibody (routine testing w rflx) - RPR  2. Supervision of other normal pregnancy, antepartum --Anticipatory guidance about next visits/weeks of pregnancy given. --Next visit in 2 weeks   - POCT urinalysis dipstick - Culture, OB Urine  3. Depression affecting pregnancy --Doing well, saw  Sue Lush today in office  4. Back pain affecting pregnancy in third trimester --Cervix closed/thick/high so no signs of PTL --Will check for UTI, dip wnl, sent for culture today --Rest/ice/heat/warm bath/Tylenol/pregnancy support belt  - cyclobenzaprine (FLEXERIL) 5 MG tablet; Take 1-2 tablets (5-10 mg total) by mouth 3 (three) times daily as needed for muscle spasms.  Dispense: 30 tablet; Refill: 0 - POCT urinalysis dipstick - Culture, OB Urine  Preterm labor symptoms and general obstetric precautions including but not limited to vaginal bleeding, contractions, leaking of fluid and fetal movement were reviewed in detail with the patient. Please refer to After Visit Summary for other counseling recommendations.   Return in about 2 weeks (around 12/16/2020).  Future Appointments  Date Time Provider Department Center  12/16/2020  9:15 AM Leftwich-Kirby, Wilmer Floor, CNM [facility] None  12/23/2020  1:00 PM [name], LCSW [facility] None    Sharen Counter, CNM

## 2020-12-02 NOTE — Patient Instructions (Signed)
Abdominal Pain During Pregnancy Abdominal pain is common during pregnancy and has many possible causes. Some causes are more serious than others, and sometimes the cause is not known. Abdominal pain can be a sign that labor is starting. It can also be caused by normal growth of your baby causing stretching of muscles and ligaments during pregnancy. Always tell your health care provider if you have any abdominal pain. Follow these instructions at home:  Do not have sex or put anything in your vagina until your pain goes away completely.  Get plenty of rest until your pain improves.  Drink enough fluid to keep your urine pale yellow.  Take over-the-counter and prescription medicines only as told by your health care provider.  Keep all follow-up visits. This is important.   Contact a health care provider if:  Your pain continues or gets worse after resting.  You have lower abdominal pain that: ? Comes and goes at regular intervals. ? Spreads to your back. ? Is similar to menstrual cramps.  You have pain or burning when you urinate. Get help right away if:  You have a fever, chills, or shortness of breath.  You have vaginal bleeding.  You are leaking fluid or passing tissue from your vagina.  You have vomiting or diarrhea that lasts for more than 24 hours.  Your baby is moving less than usual.  You feel very weak or faint.  You develop severe pain in your upper abdomen. Summary  Abdominal pain is common during pregnancy and has many possible causes.  If you experience abdominal pain during pregnancy, tell your health care provider right away.  Follow your health care provider's home care instructions and keep all follow-up visits as told. This information is not intended to replace advice given to you by your health care provider. Make sure you discuss any questions you have with your health care provider. Document Revised: 04/02/2020 Document Reviewed: 04/02/2020 Elsevier  Patient Education  2021 Elsevier Inc.  

## 2020-12-02 NOTE — Progress Notes (Signed)
Pt states she is having increase in pressure, possible ctx, LE swelling and notice of increase BP at home. BP running 130/109 at home, pt has had some HA's.  Recommend pt bring cuff to next visit.    PHQ-9 score is 6 today. GAD=3 today.

## 2020-12-03 DIAGNOSIS — O99013 Anemia complicating pregnancy, third trimester: Secondary | ICD-10-CM | POA: Insufficient documentation

## 2020-12-03 LAB — CBC
Hematocrit: 31.3 % — ABNORMAL LOW (ref 34.0–46.6)
Hemoglobin: 10.2 g/dL — ABNORMAL LOW (ref 11.1–15.9)
MCH: 29 pg (ref 26.6–33.0)
MCHC: 32.6 g/dL (ref 31.5–35.7)
MCV: 89 fL (ref 79–97)
Platelets: 177 10*3/uL (ref 150–450)
RBC: 3.52 x10E6/uL — ABNORMAL LOW (ref 3.77–5.28)
RDW: 11.7 % (ref 11.7–15.4)
WBC: 6.2 10*3/uL (ref 3.4–10.8)

## 2020-12-03 LAB — GLUCOSE TOLERANCE, 2 HOURS W/ 1HR
Glucose, 1 hour: 130 mg/dL (ref 65–179)
Glucose, 2 hour: 95 mg/dL (ref 65–152)
Glucose, Fasting: 74 mg/dL (ref 65–91)

## 2020-12-03 LAB — RPR: RPR Ser Ql: NONREACTIVE

## 2020-12-03 LAB — HIV ANTIBODY (ROUTINE TESTING W REFLEX): HIV Screen 4th Generation wRfx: NONREACTIVE

## 2020-12-03 MED ORDER — FERROUS SULFATE 325 (65 FE) MG PO TABS: 325.0000 mg | ORAL_TABLET | ORAL | 5 refills | Status: AC

## 2020-12-03 NOTE — Addendum Note (Signed)
Addended by: Sharen Counter A on: 12/03/2020 12:25 PM   Modules accepted: Orders

## 2020-12-05 LAB — URINE CULTURE, OB REFLEX

## 2020-12-05 LAB — CULTURE, OB URINE

## 2020-12-16 ENCOUNTER — Ambulatory Visit (INDEPENDENT_AMBULATORY_CARE_PROVIDER_SITE_OTHER): Payer: BLUE CROSS/BLUE SHIELD | Admitting: Advanced Practice Midwife

## 2020-12-16 ENCOUNTER — Other Ambulatory Visit: Payer: Self-pay

## 2020-12-16 VITALS — BP 107/69 | HR 106 | Wt 148.2 lb

## 2020-12-16 DIAGNOSIS — Z3480 Encounter for supervision of other normal pregnancy, unspecified trimester: Secondary | ICD-10-CM

## 2020-12-16 DIAGNOSIS — O26893 Other specified pregnancy related conditions, third trimester: Secondary | ICD-10-CM

## 2020-12-16 DIAGNOSIS — O99013 Anemia complicating pregnancy, third trimester: Secondary | ICD-10-CM

## 2020-12-16 DIAGNOSIS — R102 Pelvic and perineal pain: Secondary | ICD-10-CM

## 2020-12-16 DIAGNOSIS — Z3A30 30 weeks gestation of pregnancy: Secondary | ICD-10-CM

## 2020-12-16 DIAGNOSIS — F32A Depression, unspecified: Secondary | ICD-10-CM

## 2020-12-16 DIAGNOSIS — O99340 Other mental disorders complicating pregnancy, unspecified trimester: Secondary | ICD-10-CM

## 2020-12-16 NOTE — Progress Notes (Signed)
   PRENATAL VISIT NOTE  Subjective:  Cheyenne Hahn is a 30 y.o. [phone] at [redacted]w[redacted]d being seen today for ongoing prenatal care.  She is currently monitored for the following issues for this low-risk pregnancy and has Depression; Gastroesophageal reflux disease; Supervision of normal pregnancy, antepartum; and Anemia affecting pregnancy in third trimester on their problem list.  Patient reports intermittent pelvic pain when standing at work.  Contractions: Irritability. Vag. Bleeding: None.  Movement: Present. Denies leaking of fluid.   The following portions of the patient's history were reviewed and updated as appropriate: allergies, current medications, past family history, past medical history, past social history, past surgical history and problem list.   Objective:   Vitals:   12/16/20 0919  BP: 107/69  Pulse: (!) 106  Weight: 148 lb 3.2 oz (67.2 kg)    Fetal Status: Fetal Heart Rate (bpm): 164   Movement: Present     General:  Alert, oriented and cooperative. Patient is in no acute distress.  Skin: Skin is warm and dry. No rash noted.   Cardiovascular: Normal heart rate noted  Respiratory: Normal respiratory effort, no problems with respiration noted  Abdomen: Soft, gravid, appropriate for gestational age.  Pain/Pressure: Present     Pelvic: Cervical exam deferred        Extremities: Normal range of motion.  Edema: Trace  Mental Status: Normal mood and affect. Normal behavior. Normal judgment and thought content.   Assessment and Plan:  Pregnancy: [id number] at [redacted]w[redacted]d 1. Supervision of other normal pregnancy, antepartum --Anticipatory guidance about next visits/weeks of pregnancy given. --Next visit in 2 weeks  2. Depression affecting pregnancy --Stable, following up with Sue Lush  3. Anemia affecting pregnancy in third trimester --No s/sx, taking oral iron  4. [redacted] weeks gestation of pregnancy   5. Pelvic pain during pregnancy in third trimester, antepartum --When  standing at work, pt wearing support belt.  Pt desires to come out of work and use FMLA starting next week. Pt has worked with her job on amount of weeks off for leave so I will sign paperwork to begin leave on 12/23/20 and keep end date in September without change.    Preterm labor symptoms and general obstetric precautions including but not limited to vaginal bleeding, contractions, leaking of fluid and fetal movement were reviewed in detail with the patient. Please refer to After Visit Summary for other counseling recommendations.   No follow-ups on file.  Future Appointments  Date Time Provider Department Center  12/23/2020  1:00 PM [name], LCSW [facility] None    Sharen Counter, CNM

## 2020-12-16 NOTE — Progress Notes (Signed)
Wants to discuss work conditions.

## 2020-12-23 ENCOUNTER — Telehealth: Payer: Self-pay | Admitting: Licensed Clinical Social Worker

## 2020-12-23 ENCOUNTER — Encounter: Payer: BLUE CROSS/BLUE SHIELD | Admitting: Licensed Clinical Social Worker

## 2020-12-23 NOTE — Telephone Encounter (Signed)
Called pt regarding scheduled mychart. Cheyenne Hahn reports forgetting about today's scheduled visit. Cheyenne Hahn was driving to her daughter school and could not complete today's scheduled visit. Cheyenne Hahn requested to have appt rescheduled for Wednesday morning 12/25/2020.

## 2020-12-25 ENCOUNTER — Encounter: Payer: BLUE CROSS/BLUE SHIELD | Admitting: Licensed Clinical Social Worker

## 2020-12-31 ENCOUNTER — Ambulatory Visit (INDEPENDENT_AMBULATORY_CARE_PROVIDER_SITE_OTHER): Payer: BLUE CROSS/BLUE SHIELD | Admitting: Obstetrics and Gynecology

## 2020-12-31 ENCOUNTER — Other Ambulatory Visit: Payer: Self-pay

## 2020-12-31 ENCOUNTER — Other Ambulatory Visit (HOSPITAL_COMMUNITY)
Admission: RE | Admit: 2020-12-31 | Discharge: 2020-12-31 | Disposition: A | Discharge status: Home / Self Care | Payer: BLUE CROSS/BLUE SHIELD | Source: Ambulatory Visit | Attending: Obstetrics and Gynecology | Admitting: Obstetrics and Gynecology

## 2020-12-31 VITALS — BP 111/64 | HR 98 | Wt 153.4 lb

## 2020-12-31 DIAGNOSIS — Z3480 Encounter for supervision of other normal pregnancy, unspecified trimester: Secondary | ICD-10-CM

## 2020-12-31 DIAGNOSIS — Z124 Encounter for screening for malignant neoplasm of cervix: Secondary | ICD-10-CM

## 2020-12-31 NOTE — Progress Notes (Signed)
   PRENATAL VISIT NOTE  Subjective:  Cheyenne Hahn is a 30 y.o. [phone] at [redacted]w[redacted]d being seen today for ongoing prenatal care.  She is currently monitored for the following issues for this low-risk pregnancy and has Depression; Gastroesophageal reflux disease; Supervision of normal pregnancy, antepartum; Anemia affecting pregnancy in third trimester; and Cervical cancer screening on their problem list.  Patient reports round ligament pain. Wearing a pregnancy support belt regularly.  Contractions: Irritability.  .  Movement: Absent. Denies leaking of fluid.   The following portions of the patient's history were reviewed and updated as appropriate: allergies, current medications, past family history, past medical history, past social history, past surgical history and problem list.   Objective:   Vitals:   12/31/20 1445  BP: 111/64  Pulse: 98  Weight: 153 lb 6.4 oz (69.6 kg)    Fetal Status: Fetal Heart Rate (bpm): 133   Movement: Absent     General:  Alert, oriented and cooperative. Patient is in no acute distress.  Skin: Skin is warm and dry. No rash noted.   Cardiovascular: Normal heart rate noted  Respiratory: Normal respiratory effort, no problems with respiration noted  Abdomen: Soft, gravid, appropriate for gestational age.  Pain/Pressure: Present     Pelvic: Cervical exam deferred       pap collected, cervix appears closed.   Extremities: Normal range of motion.  Edema: Trace  Mental Status: Normal mood and affect. Normal behavior. Normal judgment and thought content.   Assessment and Plan:  Pregnancy: [id number] at [redacted]w[redacted]d  1. Supervision of other normal pregnancy, antepartum  - Cytology - PAP( [place])  2. Cervical cancer screening  -Pap collected today.   Preterm labor symptoms and general obstetric precautions including but not limited to vaginal bleeding, contractions, leaking of fluid and fetal movement were reviewed in detail with the patient. Please refer to  After Visit Summary for other counseling recommendations.   Return in about 2 weeks (around 01/14/2021) for low risk, for GBS.  Future Appointments  Date Time Provider Department Center  01/13/2021  9:55 AM Leftwich-Kirby, Wilmer Floor, CNM [facility] None    Venia Carbon, NP

## 2021-01-02 LAB — CYTOLOGY - PAP
Comment: NEGATIVE
Diagnosis: NEGATIVE
High risk HPV: NEGATIVE

## 2021-01-13 ENCOUNTER — Ambulatory Visit (INDEPENDENT_AMBULATORY_CARE_PROVIDER_SITE_OTHER): Payer: BLUE CROSS/BLUE SHIELD | Admitting: Advanced Practice Midwife

## 2021-01-13 ENCOUNTER — Other Ambulatory Visit: Payer: Self-pay

## 2021-01-13 VITALS — BP 117/70 | HR 104 | Wt 157.4 lb

## 2021-01-13 DIAGNOSIS — O99340 Other mental disorders complicating pregnancy, unspecified trimester: Secondary | ICD-10-CM

## 2021-01-13 DIAGNOSIS — F32A Depression, unspecified: Secondary | ICD-10-CM

## 2021-01-13 DIAGNOSIS — R519 Headache, unspecified: Secondary | ICD-10-CM

## 2021-01-13 DIAGNOSIS — Z3480 Encounter for supervision of other normal pregnancy, unspecified trimester: Secondary | ICD-10-CM

## 2021-01-13 DIAGNOSIS — Z3A34 34 weeks gestation of pregnancy: Secondary | ICD-10-CM

## 2021-01-13 DIAGNOSIS — O99013 Anemia complicating pregnancy, third trimester: Secondary | ICD-10-CM

## 2021-01-13 DIAGNOSIS — O26893 Other specified pregnancy related conditions, third trimester: Secondary | ICD-10-CM

## 2021-01-13 MED ORDER — BUTALBITAL-APAP-CAFFEINE 50-325-40 MG PO TABS: 1.0000 | ORAL_TABLET | Freq: Four times a day (QID) | ORAL | 0 refills | Status: AC | PRN

## 2021-01-13 NOTE — Patient Instructions (Signed)

## 2021-01-13 NOTE — Progress Notes (Signed)
   PRENATAL VISIT NOTE  Subjective:  Cheyenne Hahn is a 30 y.o. [phone] at [redacted]w[redacted]d being seen today for ongoing prenatal care.  She is currently monitored for the following issues for this low-risk pregnancy and has Depression; Gastroesophageal reflux disease; Supervision of normal pregnancy, antepartum; Anemia affecting pregnancy in third trimester; and Cervical cancer screening on their problem list.  Patient reports headache.  Contractions: Irritability. Vag. Bleeding: None.  Movement: Present. Denies leaking of fluid.   The following portions of the patient's history were reviewed and updated as appropriate: allergies, current medications, past family history, past medical history, past social history, past surgical history and problem list.   Objective:   Vitals:   01/13/21 0942  BP: 117/70  Pulse: (!) 104  Weight: 157 lb 6.4 oz (71.4 kg)    Fetal Status: Fetal Heart Rate (bpm): 140 Fundal Height: 34 cm Movement: Present     General:  Alert, oriented and cooperative. Patient is in no acute distress.  Skin: Skin is warm and dry. No rash noted.   Cardiovascular: Normal heart rate noted  Respiratory: Normal respiratory effort, no problems with respiration noted  Abdomen: Soft, gravid, appropriate for gestational age.  Pain/Pressure: Present     Pelvic: Cervical exam deferred        Extremities: Normal range of motion.  Edema: Trace  Mental Status: Normal mood and affect. Normal behavior. Normal judgment and thought content.   Assessment and Plan:  Pregnancy: [id number] at [redacted]w[redacted]d 1. Supervision of other normal pregnancy, antepartum --Anticipatory guidance about next visits/weeks of pregnancy given. --Next visit in 2 weeks  2. Depression affecting pregnancy --Stable, not on medications  3. Anemia affecting pregnancy in third trimester --hgb 10.2 on 12/02/20, but given new onset h/a and no evidence of HTN or dehydration, will reevaluate --Pt taking gummy PNV and every other day  ferous sulfate  4. [redacted] weeks gestation of pregnancy  5. Headache in pregnancy, antepartum, third trimester --Headache daily, with sensitivity to light and sound, resolved with quiet dark room and sleep.  BP grossly normal today, no hx HTN, and no s/sx of PEC other than h/a today. --Will evaluate for worsening anemia --Rx for Fioricet sent, pt to try occasionally - CBC   Preterm labor symptoms and general obstetric precautions including but not limited to vaginal bleeding, contractions, leaking of fluid and fetal movement were reviewed in detail with the patient. Please refer to After Visit Summary for other counseling recommendations.   Return in about 2 weeks (around 01/27/2021).  Future Appointments  Date Time Provider Department Center  01/27/2021 10:35 AM Burleson, Brand Males, NP [facility] None     Sharen Counter, CNM

## 2021-01-13 NOTE — Progress Notes (Signed)
Patient presents for ROB. Patient has no concerns today. 

## 2021-01-14 ENCOUNTER — Other Ambulatory Visit: Payer: Self-pay | Admitting: Advanced Practice Midwife

## 2021-01-14 LAB — CBC
Hematocrit: 28.9 % — ABNORMAL LOW (ref 34.0–46.6)
Hemoglobin: 9.5 g/dL — ABNORMAL LOW (ref 11.1–15.9)
MCH: 28.2 pg (ref 26.6–33.0)
MCHC: 32.9 g/dL (ref 31.5–35.7)
MCV: 86 fL (ref 79–97)
Platelets: 175 10*3/uL (ref 150–450)
RBC: 3.37 x10E6/uL — ABNORMAL LOW (ref 3.77–5.28)
RDW: 12.5 % (ref 11.7–15.4)
WBC: 7.4 10*3/uL (ref 3.4–10.8)

## 2021-01-14 NOTE — Progress Notes (Signed)
Orders for signed and held IV Venofer for infusion center placed

## 2021-01-22 ENCOUNTER — Inpatient Hospital Stay (HOSPITAL_COMMUNITY): Admission: RE | Admit: 2021-01-22 | Payer: BLUE CROSS/BLUE SHIELD | Source: Ambulatory Visit

## 2021-01-22 ENCOUNTER — Other Ambulatory Visit: Payer: Self-pay

## 2021-01-22 ENCOUNTER — Ambulatory Visit (INDEPENDENT_AMBULATORY_CARE_PROVIDER_SITE_OTHER): Payer: BLUE CROSS/BLUE SHIELD | Admitting: Women's Health

## 2021-01-22 VITALS — BP 112/71 | HR 104 | Wt 157.0 lb

## 2021-01-22 DIAGNOSIS — O368131 Decreased fetal movements, third trimester, fetus 1: Secondary | ICD-10-CM | POA: Diagnosis not present

## 2021-01-22 DIAGNOSIS — Z3A35 35 weeks gestation of pregnancy: Secondary | ICD-10-CM | POA: Diagnosis not present

## 2021-01-22 DIAGNOSIS — O26843 Uterine size-date discrepancy, third trimester: Secondary | ICD-10-CM

## 2021-01-22 DIAGNOSIS — O99013 Anemia complicating pregnancy, third trimester: Secondary | ICD-10-CM

## 2021-01-22 DIAGNOSIS — F32A Depression, unspecified: Secondary | ICD-10-CM

## 2021-01-22 DIAGNOSIS — O368130 Decreased fetal movements, third trimester, not applicable or unspecified: Secondary | ICD-10-CM

## 2021-01-22 DIAGNOSIS — Z3480 Encounter for supervision of other normal pregnancy, unspecified trimester: Secondary | ICD-10-CM

## 2021-01-22 DIAGNOSIS — O99343 Other mental disorders complicating pregnancy, third trimester: Secondary | ICD-10-CM

## 2021-01-22 NOTE — Progress Notes (Signed)
Subjective:  Cheyenne Hahn is a 30 y.o. [id number] at [redacted]w[redacted]d being seen today for ongoing prenatal care.  She is currently monitored for the following issues for this low-risk pregnancy and has Depression; Gastroesophageal reflux disease; Supervision of normal pregnancy, antepartum; Anemia affecting pregnancy in third trimester; Cervical cancer screening; and Uterine size-date discrepancy in third trimester on their problem list.  Patient reports  DFM  and reports return of normal movement once baby is on monitor.  Contractions: Irritability. Vag. Bleeding: None.  Movement: Present. Denies leaking of fluid.   The following portions of the patient's history were reviewed and updated as appropriate: allergies, current medications, past family history, past medical history, past social history, past surgical history and problem list. Problem list updated.  Objective:   Vitals:   01/22/21 1358  BP: 112/71  Pulse: (!) 104  Weight: 157 lb (71.2 kg)    Fetal Status: Fetal Heart Rate (bpm): NST Fundal Height: 39 cm Movement: Present     General:  Alert, oriented and cooperative. Patient is in no acute distress.  Skin: Skin is warm and dry. No rash noted.   Cardiovascular: Normal heart rate noted  Respiratory: Normal respiratory effort, no problems with respiration noted  Abdomen: Soft, gravid, appropriate for gestational age. Pain/Pressure: Present     Pelvic: Vag. Bleeding: None     Cervical exam deferred        Extremities: Normal range of motion.     Mental Status: Normal mood and affect. Normal behavior. Normal judgment and thought content.   Urinalysis:      Assessment and Plan:  Pregnancy: [id number] at [redacted]w[redacted]d  1. Supervision of other normal pregnancy, antepartum -GBS/cultures next visit -S>D, order entered for growth [place]  2. Anemia affecting pregnancy in third trimester -Venofer cancelled this morning d/t lack of childcare, rescheduled for 01/29/2021  3. Depression, unspecified  depression type -referral to [facility], pt reports worsening depression, denies SI/HI, but reports she does want pregnancy to be over, message sent to Angelina Sheriff for urgent consultation, pt aware to present to MAU or any ED with worsening s/sx of depression  4. [redacted] weeks gestation of pregnancy  5. Decreased fetal movements in third trimester, single or unspecified fetus -EFM: reactive       -baseline: 130       -variability: moderate       -accels: present, 15x15       -decels: absent       -TOCO: irritability  Preterm labor symptoms and general obstetric precautions including but not limited to vaginal bleeding, contractions, leaking of fluid and fetal movement were reviewed in detail with the patient. I discussed the assessment and treatment plan with the patient. The patient was provided an opportunity to ask questions and all were answered. The patient agreed with the plan and demonstrated an understanding of the instructions. The patient was advised to call back or seek an in-person office evaluation/go to [facility]U at [facility] for any urgent or concerning symptoms. Please refer to After Visit Summary for other counseling recommendations.  Return in about 1 week (around 01/29/2021) for in-person LOB/MD ONLY discuss early delivery, needs growth [place].   Allye Hoyos, Odie Sera, NP

## 2021-01-22 NOTE — Patient Instructions (Signed)
Maternity Assessment Unit (MAU)  The Maternity Assessment Unit (MAU) is located at the [facility] and [place] at [facility]. The address is: [address], [place], [place], [place] [address]. Please see map below for additional directions.    The Maternity Assessment Unit is designed to help you during your pregnancy, and for up to 6 weeks after delivery, with any pregnancy- or postpartum-related emergencies, if you think you are in labor, or if your water has broken. For example, if you experience nausea and vomiting, vaginal bleeding, severe abdominal or pelvic pain, elevated blood pressure or other problems related to your pregnancy or postpartum time, please come to the Maternity Assessment Unit for assistance.       Group B Streptococcus Test During Pregnancy Why am I having this test? Routine testing, also called screening, for group B streptococcus (GBS) is recommended for all pregnant women between the 36th and 37th week of pregnancy. GBS is a type of bacteria that can be passed from mother to baby during childbirth. Screening will help guide whether or not you will need treatment during labor and delivery to prevent complications such as: An infection in your uterus during labor. An infection in your uterus after delivery. A serious infection in your baby after delivery, such as pneumonia, meningitis, or sepsis. GBS screening is not often done before 36 weeks of pregnancy unless you go intolabor prematurely. What happens if I have group B streptococcus? If testing shows that you have GBS, your health care provider will recommend treatment with IV antibiotics during labor and delivery. This treatmentsignificantly decreases the risk of complications for you and your baby. If you have a planned C-section and you have GBS, you may not need to be treated with antibiotics because GBS is usually passed to babies after labor starts and your water breaks. If you are in  labor or your water breaks before your C-section, it is possible for GBS to get into your uterus and be passed toyour baby, so you might need treatment. Is there a chance I may not need to be tested? You may not need to be tested for GBS if: You have a urine test that shows GBS before 36 to 37 weeks. You had a baby with GBS infection after a previous delivery. In these cases, you will automatically be treated for GBS during labor anddelivery. What is being tested? This test is done to check if you have group B streptococcus in your vagina orrectum. What kind of sample is taken? To collect samples for this test, your health care provider will swab your vagina and rectum with a cotton swab. The sample is then sent to the lab to seeif GBS is present. What happens during the test?  You will remove your clothing from the waist down. You will lie down on an exam table in the same position as you would for a pelvic exam. Your health care provider will swab your vagina and rectum to collect samples for a culture test. You will be able to go home after the test and do all your usual activities. How are the results reported? The test results are reported as positive or negative. What do the results mean? A positive test means you are at risk for passing GBS to your baby during labor and delivery. Your health care provider will recommend that you are treated with an IV antibiotic during labor and delivery. A negative test means you are at very low risk of passing  GBS to your baby. There is still a low risk of passing GBS to your baby because sometimes test results may report that you do not have a condition when you do (false-negative result) or there is a chance that you may become infected with GBS after the test is done. You most likely will not need to be treated with an antibiotic during labor and delivery. Talk with your health care provider about what your results mean. Questions to ask your health  care provider Ask your health care provider, or the department that is doing the test: When will my results be ready? How will I get my results? What are my treatment options? Summary Routine testing (screening) for group B streptococcus (GBS) is recommended for all pregnant women between the 36th and 37th week of pregnancy. GBS is a type of bacteria that can be passed from mother to baby during childbirth. If testing shows that you have GBS, your health care provider will recommend that you are treated with IV antibiotics during labor and delivery. This treatment almost always prevents infection in newborns. This information is not intended to replace advice given to you by your health care provider. Make sure you discuss any questions you have with your healthcare provider. Document Revised: 05/21/2020 Document Reviewed: 08/17/2018 Elsevier Patient Education  2022 Elsevier Inc.       Pregnancy and Anemia  Anemia is a condition in which there is not enough red blood cells or hemoglobin in the blood. Hemoglobin is a substance in red blood cells that carries oxygen. When you do not have enough red blood cells or hemoglobin (are anemic), your body cannot get enough oxygen and your organs may not work properly. Anemia is common during pregnancy because your body needs more blood volume andblood cells to provide nutrition to the unborn baby. What are the causes? The most common cause of anemia during pregnancy is not having enough iron in the body to make red blood cells (iron deficiency anemia). Other causes may include: Folic acid deficiency. Vitamin B12 deficiency. Certain prescription or over-the-counter medicines. Certain medical conditions or infections that destroy red blood cells. A low platelet count and bleeding caused by antibodies that go through the placenta to the baby from the mother's blood. What are the signs or symptoms? Mild anemia may not cause any symptoms. If anemia  becomes severe, symptoms may include: Feeling tired or weak. Shortness of breath, especially during activity. Fainting. Pale skin. Headaches. A fast or irregular heartbeat. Dizziness. How is this diagnosed? This condition may be diagnosed based on your medical history and a physicalexam. You may also have blood tests. How is this treated? Treatment for anemia during pregnancy depends on the cause of the anemia. Treatment may include: Making changes to your diet. Taking iron, vitamin B12, or folic acid supplements. Having a blood transfusion. This may be needed if the anemia is severe. Follow these instructions at home: Eating and drinking Follow recommendations from your health care provider about changing your diet. Eat a diet rich in iron. This would include foods such as: Liver. Beef. Eggs. Whole grains. Spinach. Dried fruit. Increase your vitamin C intake. This will help the stomach absorb more iron. Some foods that are high in vitamin C include: Oranges. Peppers. Tomatoes. Mangoes. Eat green leafy vegetables. These are a good source of folic acid. General instructions Take iron supplements and vitamins as told by your health care provider. Keep all follow-up visits. This is important. Contact a health care provider  if: You have headaches that happen often or do not go away. You bruise easily. You have a fever. You have nausea and vomiting for more than 24 hours. You are unable to take supplements prescribed to treat your anemia. Get help right away if: You develop signs or symptoms of severe anemia. You have bleeding from your vagina. You develop a rash. You have bloody or tarry stools. You are very dizzy or you faint. Summary Anemia is a condition in which there is not enough red blood cells or hemoglobin in the blood. The most common cause of anemia during pregnancy is not having enough iron in the body to make red blood cells (iron deficiency anemia). Mild  anemia may not cause any symptoms. If it becomes severe, symptoms may include feeling tired and weak. Take iron supplements and vitamins as told by your health care provider. Keep all follow-up visits. This is important. This information is not intended to replace advice given to you by your health care provider. Make sure you discuss any questions you have with your healthcare provider. Document Revised: 11/21/2019 Document Reviewed: 11/21/2019 Elsevier Patient Education  2022 [employer].

## 2021-01-27 ENCOUNTER — Encounter: Payer: BLUE CROSS/BLUE SHIELD | Admitting: Nurse Practitioner

## 2021-01-29 ENCOUNTER — Other Ambulatory Visit: Payer: Self-pay

## 2021-01-29 ENCOUNTER — Encounter: Payer: BLUE CROSS/BLUE SHIELD | Admitting: Family Medicine

## 2021-01-29 ENCOUNTER — Inpatient Hospital Stay (HOSPITAL_COMMUNITY)
Admission: AD | Admit: 2021-01-29 | Discharge: 2021-01-29 | Disposition: A | Discharge status: Home / Self Care | Payer: BLUE CROSS/BLUE SHIELD | Attending: Obstetrics & Gynecology | Admitting: Obstetrics & Gynecology

## 2021-01-29 ENCOUNTER — Ambulatory Visit (HOSPITAL_COMMUNITY)
Admission: RE | Admit: 2021-01-29 | Discharge: 2021-01-29 | Disposition: A | Discharge status: Home / Self Care | Payer: BLUE CROSS/BLUE SHIELD | Source: Ambulatory Visit | Attending: Advanced Practice Midwife | Admitting: Advanced Practice Midwife

## 2021-01-29 ENCOUNTER — Encounter (HOSPITAL_COMMUNITY): Payer: Self-pay | Admitting: Obstetrics & Gynecology

## 2021-01-29 DIAGNOSIS — O479 False labor, unspecified: Secondary | ICD-10-CM

## 2021-01-29 DIAGNOSIS — Z0371 Encounter for suspected problem with amniotic cavity and membrane ruled out: Secondary | ICD-10-CM | POA: Diagnosis not present

## 2021-01-29 DIAGNOSIS — Z3A34 34 weeks gestation of pregnancy: Secondary | ICD-10-CM | POA: Insufficient documentation

## 2021-01-29 DIAGNOSIS — O4703 False labor before 37 completed weeks of gestation, third trimester: Secondary | ICD-10-CM

## 2021-01-29 DIAGNOSIS — Z3A36 36 weeks gestation of pregnancy: Secondary | ICD-10-CM

## 2021-01-29 DIAGNOSIS — O99013 Anemia complicating pregnancy, third trimester: Secondary | ICD-10-CM | POA: Insufficient documentation

## 2021-01-29 LAB — POCT FERN TEST: POCT Fern Test: NEGATIVE

## 2021-01-29 MED ORDER — METHYLPREDNISOLONE SODIUM SUCC 125 MG IJ SOLR: 125.0000 mg | Freq: Once | INTRAMUSCULAR | Status: DC | PRN

## 2021-01-29 MED ORDER — EPINEPHRINE PF 1 MG/ML IJ SOLN: 0.3000 mg | Freq: Once | INTRAMUSCULAR | Status: DC | PRN

## 2021-01-29 MED ORDER — SODIUM CHLORIDE 0.9 % IV SOLN
500.0000 mg | Freq: Once | INTRAVENOUS | Status: DC
  Filled 2021-01-29: qty 25

## 2021-01-29 MED ORDER — DIPHENHYDRAMINE HCL 50 MG/ML IJ SOLN: 25.0000 mg | Freq: Once | INTRAMUSCULAR | Status: DC | PRN

## 2021-01-29 MED ORDER — SODIUM CHLORIDE 0.9 % IV SOLN: INTRAVENOUS | Status: DC | PRN

## 2021-01-29 MED ORDER — ALBUTEROL SULFATE (2.5 MG/3ML) 0.083% IN NEBU: 2.5000 mg | INHALATION_SOLUTION | Freq: Once | RESPIRATORY_TRACT | Status: DC | PRN

## 2021-01-29 MED ORDER — SODIUM CHLORIDE 0.9 % IV BOLUS: 500.0000 mL | Freq: Once | INTRAVENOUS | Status: DC | PRN

## 2021-01-29 NOTE — MAU Provider Note (Signed)
Event Date/Time   First Provider Initiated Contact with Patient 01/29/21 1101       S: Ms. Cheyenne Hahn is a 30 y.o. [id number] at 105w5d  who presents to MAU today complaining of contractions and leaking of fluid since last night, with wet underwear x 2, not requiring a pad. She denies any vaginal bleeding. She presented to the IV infusion center for her Venofer iron infusion but was reporting contractions and was sent to MAU for evaluation. She reports normal fetal movement.    O: BP 113/71 (BP Location: Right Arm)   Pulse 83   Temp 98.3 F (36.8 C) (Oral)   Resp 18   Ht 5\' 3"  (1.6 m)   Wt 72.1 kg   LMP 05/17/2020   SpO2 100%   BMI 28.15 kg/m  GENERAL: Well-developed, well-nourished female in no acute distress.  HEAD: Normocephalic, atraumatic.  CHEST: Normal effort of breathing, regular heart rate ABDOMEN: Soft, nontender, gravid PELVIC: Normal external female genitalia. Vagina is pink and rugated. Cervix with normal contour, no lesions. Normal discharge.  negative pooling.   Cervical exam:  Dilation: 1 Effacement (%): 50 Station: -3 Presentation: Vertex Exam by:: L. Leftwich-Kirby, CNM   Fetal Monitoring: Baseline: 135 Variability: moderate Accelerations: present Decelerations: none Contractions: every 2-6 minutes, mild to moderate to palpation  Results for orders placed or performed during the hospital encounter of 01/29/21 (from the past 24 hour(s))  POCT fern test     Status: None   Collection Time: 01/29/21 10:55 AM  Result Value Ref Range   POCT Fern Test Negative = intact amniotic membranes      MDM:    RN checked pt cervix at 0945, CNM checked at 11:00 am.  Cervix 1/50/-3 and unchanged on CNM exam.  No evidence of ROM with negative pooling and ferning.  Pt declines any IV fluids or Procardia in MAU when offered.  Plan to recheck in 1 hour, so 2 hours from first exam and discharge if no evidence of active labor.  A: SIUP at [redacted]w[redacted]d  Membranes intact 1.  Braxton Hicks contractions   2. Encounter for suspected premature rupture of amniotic membranes, with rupture of membranes not found   3. [redacted] weeks gestation of pregnancy     P: D/C home with preterm labor precautions Message sent to [facility] to reschedule today's prenatal appt and IV iron infusion.  GBS and GC collected in MAU today.  [facility], CNM 01/29/2021 12:21 PM

## 2021-01-29 NOTE — MAU Note (Addendum)
Presents with ctxs 5 minutes apart since 0800 this morning.  Denies VB, unsure if LOF.  Endorses +FM. States was sent over from infusion center secondary having ctxs.

## 2021-01-29 NOTE — Discharge Instructions (Signed)
Reasons to return to MAU at  [facility]:  1.  Contractions are  5 minutes apart or less, each last 1 minute, these have been going on for 1-2 hours, and you cannot walk or talk during them 2.  You have a large gush of fluid, or a trickle of fluid that will not stop and you have to wear a pad 3.  You have bleeding that is bright red, heavier than spotting--like menstrual bleeding (spotting can be normal in early labor or after a check of your cervix) 4.  You do not feel the baby moving like he/she normally does  

## 2021-01-29 NOTE — Progress Notes (Signed)
Pt here today for IV venofer.  Stated she had contractions that started last night, abd is hard, and contractions every 5 min apart, stated she does not think her water has broken and does not see any bleeding.  Called rapid response who stated to take the patient to MAU.  Venofer not given and pharmacy notified.

## 2021-01-30 LAB — GC/CHLAMYDIA PROBE AMP (~~LOC~~) NOT AT ARMC
Chlamydia: NEGATIVE
Comment: NEGATIVE
Comment: NORMAL
Neisseria Gonorrhea: NEGATIVE

## 2021-01-31 ENCOUNTER — Encounter (HOSPITAL_COMMUNITY)
Admission: RE | Admit: 2021-01-31 | Discharge: 2021-01-31 | Disposition: A | Discharge status: Home / Self Care | Payer: BLUE CROSS/BLUE SHIELD | Source: Ambulatory Visit | Attending: Advanced Practice Midwife | Admitting: Advanced Practice Midwife

## 2021-01-31 DIAGNOSIS — O99013 Anemia complicating pregnancy, third trimester: Secondary | ICD-10-CM | POA: Diagnosis present

## 2021-01-31 DIAGNOSIS — Z3A37 37 weeks gestation of pregnancy: Secondary | ICD-10-CM | POA: Insufficient documentation

## 2021-01-31 LAB — CULTURE, BETA STREP (GROUP B ONLY)

## 2021-01-31 MED ORDER — METHYLPREDNISOLONE SODIUM SUCC 125 MG IJ SOLR: 125.0000 mg | Freq: Once | INTRAMUSCULAR | Status: DC | PRN

## 2021-01-31 MED ORDER — SODIUM CHLORIDE 0.9 % IV SOLN
500.0000 mg | Freq: Once | INTRAVENOUS | Status: AC
  Administered 2021-01-31: 500 mg via INTRAVENOUS
  Filled 2021-01-31: qty 25

## 2021-01-31 MED ORDER — SODIUM CHLORIDE 0.9 % IV BOLUS: 500.0000 mL | Freq: Once | INTRAVENOUS | Status: DC | PRN

## 2021-01-31 MED ORDER — ALBUTEROL SULFATE (2.5 MG/3ML) 0.083% IN NEBU: 2.5000 mg | INHALATION_SOLUTION | Freq: Once | RESPIRATORY_TRACT | Status: DC | PRN

## 2021-01-31 MED ORDER — EPINEPHRINE PF 1 MG/ML IJ SOLN
0.3000 mg | Freq: Once | INTRAMUSCULAR | Status: DC | PRN
Start: 2021-01-31 — End: 2021-02-01

## 2021-01-31 MED ORDER — SODIUM CHLORIDE 0.9 % IV SOLN: INTRAVENOUS | Status: DC | PRN

## 2021-01-31 MED ORDER — DIPHENHYDRAMINE HCL 50 MG/ML IJ SOLN: 25.0000 mg | Freq: Once | INTRAMUSCULAR | Status: DC | PRN

## 2021-02-04 ENCOUNTER — Encounter: Payer: BLUE CROSS/BLUE SHIELD | Admitting: Obstetrics and Gynecology

## 2021-02-05 ENCOUNTER — Ambulatory Visit: Payer: BLUE CROSS/BLUE SHIELD

## 2021-02-21 ENCOUNTER — Inpatient Hospital Stay (HOSPITAL_COMMUNITY): Admit: 2021-02-21 | Payer: Self-pay

## 2021-03-04 ENCOUNTER — Ambulatory Visit: Payer: BLUE CROSS/BLUE SHIELD | Admitting: Family Medicine

## 2021-03-05 ENCOUNTER — Ambulatory Visit (INDEPENDENT_AMBULATORY_CARE_PROVIDER_SITE_OTHER): Payer: BLUE CROSS/BLUE SHIELD | Admitting: Licensed Clinical Social Worker

## 2021-03-05 ENCOUNTER — Other Ambulatory Visit: Payer: Self-pay

## 2021-03-05 DIAGNOSIS — Z9189 Other specified personal risk factors, not elsewhere classified: Secondary | ICD-10-CM

## 2021-03-05 DIAGNOSIS — F32A Depression, unspecified: Secondary | ICD-10-CM

## 2021-03-05 NOTE — BH Specialist Note (Signed)
Integrated Behavioral Health via Telemedicine Visit  03/05/2021 Cheyenne Hahn [id number]  Number of Integrated Behavioral Health visits: 3 Session Start time: 1:45pm  Session End time: 2:15pm Total time: 30 via phone per pt request   Referring Provider: Farris Has NP Patient/Family location: Home  [facility] Provider location: [employer] [facility]  All persons participating in visit: Pt Cheyenne Hahn and LCSWA Cheyenne. Felton Clinton  Types of Service: General Behavioral Integrated Care (BHI)  I connected with Cheyenne Hahn and/or Cheyenne Hahn's n/Cheyenne via  Telephone or [employer]  (Video is [employer]) and verified that I am speaking with the correct person using two identifiers. Discussed confidentiality: Yes   I discussed the limitations of telemedicine and the availability of in person appointments.  Discussed there is Cheyenne possibility of technology failure and discussed alternative modes of communication if that failure occurs.  I discussed that engaging in this telemedicine visit, they consent to the provision of behavioral healthcare and the services will be billed under their insurance.  Patient and/or legal guardian expressed understanding and consented to Telemedicine visit: Yes   Presenting Concerns: Patient and/or family reports the following symptoms/concerns: at risk for postpartum depression  Duration of problem: Over one year ; Severity of problem: mild  Patient and/or Family's Strengths/Protective Factors: Concrete supports in place (healthy food, safe environments, etc.)  Goals Addressed: Patient will:  Reduce symptoms of: depression   Increase knowledge and/or ability of: coping skills   Demonstrate ability to: Increase healthy adjustment to current life circumstances and Increase adequate support systems for patient/family  Progress towards Goals: Ongoing  Interventions: Interventions utilized:  Supportive Counseling Standardized Assessments  completed: Edinburgh Postnatal Depression  Patient and/or Family Response: Ease into work and normal activities. Schedule time for self care   Assessment: Patient currently experiencing Cheyenne Hahn reports intermittent periods of depressed mood. Cheyenne Hahn reports limited family support due to travel distance. [facility] Cheyenne. Felton Clinton discuss interventions to alleviate depressed mood and communicating needs for added support    Patient may benefit from integrated behavioral health .  Plan: Follow up with behavioral health clinician on : 3 weeks  Behavioral recommendations: Ease into work and normal activities. Schedule time for self care Referral(s): Integrated [employer] (In Clinic)  I discussed the assessment and treatment plan with the patient and/or parent/guardian. They were provided an opportunity to ask questions and all were answered. They agreed with the plan and demonstrated an understanding of the instructions.   They were advised to call back or seek an in-person evaluation if the symptoms worsen or if the condition fails to improve as anticipated.  [name], LCSW

## 2021-03-12 ENCOUNTER — Ambulatory Visit (INDEPENDENT_AMBULATORY_CARE_PROVIDER_SITE_OTHER): Payer: BLUE CROSS/BLUE SHIELD | Admitting: Obstetrics

## 2021-03-12 ENCOUNTER — Other Ambulatory Visit: Payer: Self-pay

## 2021-03-12 ENCOUNTER — Encounter: Payer: Self-pay | Admitting: Obstetrics

## 2021-03-12 DIAGNOSIS — Z3009 Encounter for other general counseling and advice on contraception: Secondary | ICD-10-CM | POA: Diagnosis not present

## 2021-03-12 NOTE — Progress Notes (Signed)
Post Partum Visit Note  Cheyenne Hahn is a 30 y.o. [phone] female who presents for a postpartum visit. She is 4 weeks postpartum following a normal spontaneous vaginal delivery.  I have fully reviewed the prenatal and intrapartum course. The delivery was at 37 gestational weeks.  Anesthesia: epidural. Postpartum course has been. Baby is doing well. Baby is feeding by bottle - Gerber Goodstart Gentle . Bleeding no bleeding. Bowel function is normal. Bladder function is normal. Patient is not sexually active. Contraception method is IUD. Postpartum depression screening: negative.   The pregnancy intention screening data noted above was reviewed. Potential methods of contraception were discussed. The patient elected to proceed with No data recorded.   Edinburgh Postnatal Depression Scale - 03/12/21 1130       Edinburgh Postnatal Depression Scale:  In the Past 7 Days   I have been able to laugh and see the funny side of things. 0    I have looked forward with enjoyment to things. 0    I have blamed myself unnecessarily when things went wrong. 0    I have been anxious or worried for no good reason. 0    I have felt scared or panicky for no good reason. 0    Things have been getting on top of me. 0    I have been so unhappy that I have had difficulty sleeping. 0    I have felt sad or miserable. 1    I have been so unhappy that I have been crying. 0    The thought of harming myself has occurred to me. 0    Edinburgh Postnatal Depression Scale Total 1             Health Maintenance Due  Topic Date Due   COVID-19 Vaccine (1) Never done   INFLUENZA VACCINE  03/03/2021    The following portions of the patient's history were reviewed and updated as appropriate: allergies, current medications, past family history, past medical history, past social history, past surgical history, and problem list.  Review of Systems A comprehensive review of systems was negative.  Objective:  BP  123/84   Pulse 86   Wt 156 lb (70.8 kg)   LMP 05/17/2020   Breastfeeding Unknown   BMI 27.63 kg/m    General:  alert and no distress   Breasts:  normal  Lungs: clear to auscultation bilaterally  Heart:  regular rate and rhythm, S1, S2 normal, no murmur, click, rub or gallop  Abdomen: soft, non-tender; bowel sounds normal; no masses,  no organomegaly   Wound none  GU exam:  not indicated       Assessment:    1. Postpartum care following vaginal delivery - doing well  2. Encounter for other general counseling and advice on contraception - wants IUD    Plan:   Essential components of care per ACOG recommendations:  1.  Mood and well being: Patient with negative depression screening today.                                                                                                -  Patient tobacco use? No.   - hx of drug use? No.    2. Infant care and feeding:  -Patient currently breastmilk feeding? No.  -Social determinants of health (SDOH) reviewed in EPIC. No concerns  3. Sexuality, contraception and birth spacing - Patient does not want a pregnancy in the next year.  Desired family size is 3 children.  - Reviewed forms of contraception in tiered fashion. Patient desired IUD today.   - Discussed birth spacing of 18 months  4. Sleep and fatigue -Encouraged family/partner/community support of 4 hrs of uninterrupted sleep to help with mood and fatigue  5. Physical Recovery  - Discussed patients delivery and complications. She describes her labor as good. - Patient had a Vaginal, no problems at delivery. Patient had  no  lacerations. Perineal healing reviewed. Patient expressed understanding - Patient has urinary incontinence? No. - Patient is safe to resume physical and sexual activity  6.  Health Maintenance - HM due items addressed Yes - Last pap smear 12/31/20 normal   Diagnosis  Date Value Ref Range Status  12/31/2020   Final   - Negative for intraepithelial  lesion or malignancy (NILM)   Pap smear not done at today's visit.  -Breast Cancer screening indicated? No.   7. Chronic Disease/Pregnancy Condition follow up: None  - PCP follow up  Coral Ceo, MD [facility] for [facility], [facility]  03/12/21

## 2021-03-24 ENCOUNTER — Encounter: Payer: Self-pay | Admitting: Obstetrics

## 2021-04-09 ENCOUNTER — Ambulatory Visit: Payer: BLUE CROSS/BLUE SHIELD | Admitting: Obstetrics

## 2021-05-08 ENCOUNTER — Encounter: Payer: Self-pay | Admitting: Obstetrics

## 2021-05-08 ENCOUNTER — Other Ambulatory Visit: Payer: Self-pay

## 2021-05-08 ENCOUNTER — Ambulatory Visit (INDEPENDENT_AMBULATORY_CARE_PROVIDER_SITE_OTHER): Payer: BLUE CROSS/BLUE SHIELD | Admitting: Obstetrics

## 2021-05-08 VITALS — BP 125/83 | HR 69 | Ht 62.0 in | Wt 155.0 lb

## 2021-05-08 DIAGNOSIS — Z01812 Encounter for preprocedural laboratory examination: Secondary | ICD-10-CM

## 2021-05-08 DIAGNOSIS — Z30430 Encounter for insertion of intrauterine contraceptive device: Secondary | ICD-10-CM | POA: Diagnosis not present

## 2021-05-08 DIAGNOSIS — Z975 Presence of (intrauterine) contraceptive device: Secondary | ICD-10-CM

## 2021-05-08 LAB — POCT URINE PREGNANCY: Preg Test, Ur: NEGATIVE

## 2021-05-08 MED ORDER — LEVONORGESTREL 20 MCG/DAY IU IUD
1.0000 | INTRAUTERINE_SYSTEM | Freq: Once | INTRAUTERINE | Status: AC
  Administered 2021-05-08: 1 via INTRAUTERINE

## 2021-05-08 NOTE — Progress Notes (Signed)
IUD Insertion Procedure Note  Pre-operative Diagnosis: Desires Long-Acting Reversible Contraception ( LARC )  Post-operative Diagnosis: same  Indications: contraception  Procedure Details  Urine pregnancy test was done in office and result was negative.  The risks (including infection, bleeding, pain, and uterine perforation) and benefits of the procedure were explained to the patient and Written informed consent was obtained.    Cervix cleansed with Betadine. Uterus sounded to 7 cm. IUD inserted without difficulty. String visible and trimmed. Patient tolerated procedure well.  IUD Information: Mirena, Lot # [id number], Expiration date 2024/NOV.  Condition: Stable  Complications: None  Plan:  The patient was advised to call for any fever or for prolonged or severe pain or bleeding. She was advised to use NSAID as needed for mild to moderate pain.   Attending Physician Documentation: I was present for or participated in the entire procedure, including opening and closing.   Brock Bad, MD 05/08/2021 9:46 AM

## 2021-05-08 NOTE — Progress Notes (Addendum)
GYN presents today for IUD -  Mirena Insertion.  UPT today is NEGATIVE.  Administrations This Visit     levonorgestrel (MIRENA) 20 MCG/DAY IUD 1 each     Admin Date 05/08/2021 Action Given Dose 1 each Route Intrauterine Administered By Maretta Bees, RMA

## 2021-05-13 ENCOUNTER — Other Ambulatory Visit: Payer: Self-pay

## 2021-05-13 ENCOUNTER — Ambulatory Visit
Admission: RE | Admit: 2021-05-13 | Discharge: 2021-05-13 | Disposition: A | Discharge status: Home / Self Care | Payer: BLUE CROSS/BLUE SHIELD | Source: Ambulatory Visit | Attending: Obstetrics | Admitting: Obstetrics

## 2021-05-13 DIAGNOSIS — Z975 Presence of (intrauterine) contraceptive device: Secondary | ICD-10-CM

## 2021-05-19 ENCOUNTER — Ambulatory Visit: Admission: RE | Admit: 2021-05-19 | Payer: BLUE CROSS/BLUE SHIELD | Source: Ambulatory Visit

## 2021-10-01 DIAGNOSIS — U071 COVID-19: Secondary | ICD-10-CM | POA: Diagnosis not present

## 2021-10-01 DIAGNOSIS — M791 Myalgia, unspecified site: Secondary | ICD-10-CM | POA: Diagnosis not present

## 2021-10-01 DIAGNOSIS — Z20822 Contact with and (suspected) exposure to covid-19: Secondary | ICD-10-CM | POA: Diagnosis not present

## 2021-10-01 DIAGNOSIS — J029 Acute pharyngitis, unspecified: Secondary | ICD-10-CM | POA: Diagnosis not present

## 2022-11-13 ENCOUNTER — Emergency Department (HOSPITAL_COMMUNITY)
Admission: EM | Admit: 2022-11-13 | Discharge: 2022-11-13 | Disposition: A | Discharge status: Home / Self Care | Payer: Medicaid Other | Attending: Emergency Medicine | Admitting: Emergency Medicine

## 2022-11-13 ENCOUNTER — Encounter (HOSPITAL_COMMUNITY): Payer: Self-pay

## 2022-11-13 ENCOUNTER — Emergency Department (HOSPITAL_COMMUNITY): Payer: Medicaid Other

## 2022-11-13 ENCOUNTER — Other Ambulatory Visit: Payer: Self-pay

## 2022-11-13 DIAGNOSIS — R0789 Other chest pain: Secondary | ICD-10-CM | POA: Diagnosis not present

## 2022-11-13 DIAGNOSIS — S4992XA Unspecified injury of left shoulder and upper arm, initial encounter: Secondary | ICD-10-CM | POA: Insufficient documentation

## 2022-11-13 DIAGNOSIS — S0990XA Unspecified injury of head, initial encounter: Secondary | ICD-10-CM | POA: Diagnosis present

## 2022-11-13 DIAGNOSIS — Y92410 Unspecified street and highway as the place of occurrence of the external cause: Secondary | ICD-10-CM | POA: Insufficient documentation

## 2022-11-13 DIAGNOSIS — M25512 Pain in left shoulder: Secondary | ICD-10-CM | POA: Diagnosis not present

## 2022-11-13 DIAGNOSIS — S0181XA Laceration without foreign body of other part of head, initial encounter: Secondary | ICD-10-CM | POA: Insufficient documentation

## 2022-11-13 DIAGNOSIS — Z7982 Long term (current) use of aspirin: Secondary | ICD-10-CM | POA: Diagnosis not present

## 2022-11-13 DIAGNOSIS — R519 Headache, unspecified: Secondary | ICD-10-CM | POA: Diagnosis not present

## 2022-11-13 DIAGNOSIS — R0781 Pleurodynia: Secondary | ICD-10-CM | POA: Insufficient documentation

## 2022-11-13 DIAGNOSIS — G44319 Acute post-traumatic headache, not intractable: Secondary | ICD-10-CM | POA: Diagnosis not present

## 2022-11-13 MED ORDER — HYDROCODONE-ACETAMINOPHEN 5-325 MG PO TABS
1.0000 | ORAL_TABLET | Freq: Once | ORAL | Status: AC
  Administered 2022-11-13: 1 via ORAL
  Filled 2022-11-13: qty 1
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
## 2024-03-30 ENCOUNTER — Ambulatory Visit (INDEPENDENT_AMBULATORY_CARE_PROVIDER_SITE_OTHER): Admitting: Certified Nurse Midwife

## 2024-03-30 ENCOUNTER — Other Ambulatory Visit (HOSPITAL_COMMUNITY)
Admission: RE | Admit: 2024-03-30 | Discharge: 2024-03-30 | Disposition: A | Discharge status: Home / Self Care | Source: Ambulatory Visit | Attending: Certified Nurse Midwife | Admitting: Certified Nurse Midwife

## 2024-03-30 ENCOUNTER — Encounter: Payer: Self-pay | Admitting: Certified Nurse Midwife

## 2024-03-30 VITALS — BP 121/78 | HR 65 | Ht 63.0 in | Wt 131.9 lb

## 2024-03-30 DIAGNOSIS — T8332XA Displacement of intrauterine contraceptive device, initial encounter: Secondary | ICD-10-CM

## 2024-03-30 DIAGNOSIS — Z3009 Encounter for other general counseling and advice on contraception: Secondary | ICD-10-CM

## 2024-03-30 DIAGNOSIS — Z202 Contact with and (suspected) exposure to infections with a predominantly sexual mode of transmission: Secondary | ICD-10-CM

## 2024-03-30 DIAGNOSIS — Z01419 Encounter for gynecological examination (general) (routine) without abnormal findings: Secondary | ICD-10-CM | POA: Diagnosis not present

## 2024-03-30 DIAGNOSIS — Z30433 Encounter for removal and reinsertion of intrauterine contraceptive device: Secondary | ICD-10-CM

## 2024-03-30 DIAGNOSIS — Z1331 Encounter for screening for depression: Secondary | ICD-10-CM | POA: Diagnosis not present

## 2024-03-30 MED ORDER — LEVONORGESTREL 20 MCG/DAY IU IUD
1.0000 | INTRAUTERINE_SYSTEM | Freq: Once | INTRAUTERINE | Status: AC
  Administered 2024-03-30: 1 via INTRAUTERINE

## 2024-03-30 NOTE — Progress Notes (Signed)
 Pt presents for annual. Pt wants swab std testing, partner tested + for chlamydia. Pt states that her IUD is giving her pain, had it for 3 years. Pt scored 8 on PHQ-9 and 12 on GAD-7, pt would like a referral to talk to [place].

## 2024-03-30 NOTE — Procedures (Signed)
    GYNECOLOGY CLINIC PROCEDURE NOTE  Ms. Cheyenne Hahn is a 33 y.o. [id number] here for MIrena  IUD removal & Mirena  Reinsertion. No GYN concerns.  Last pap smear was on 01/01/24 and was normal.  IUD Removal & Reinsertion  Patient was in the dorsal lithotomy position, normal external genitalia was noted.  A speculum was placed in the patient's vagina, normal discharge was noted, no lesions. The multiparous cervix was visualized, no lesions, no abnormal discharge. IUD had migrated to LUS and partially outside of cervix. CNM attempted to grasp and gently removed but was unsuccessful x2. Dr. Abigail called to bedside to assess removal. Dr. Abigail was able to grasp neck of IUD with ring forceps and remove IUD completely with minimal difficulty. Device intact.  Patient tolerated the procedure well.    IUD Insertion  Patient identified, informed consent performed.  Discussed risks of irregular bleeding, cramping, infection, malpositioning or misplacement of the IUD outside the uterus which may require further procedure such as laparoscopy. Time out was performed.  Urine pregnancy test negative.  Cervix visualized.  Cleaned with Betadine x 2. Uterus sounded to 9 cm.  Mirena  IUD placed per manufacturer's recommendations.  Strings trimmed to 3 cm. Patient tolerated procedure well.   Patient was given post-procedure instructions.  She was advised to be have backup contraception for one week.  Patient was also asked to check IUD strings periodically and follow up in 4 weeks for IUD check if having complications.   Deniesha Stenglein Erven) Emilio, MSN, CNM  [facility]  03/30/2024 12:53 PM

## 2024-03-30 NOTE — Progress Notes (Signed)
 GYNECOLOGY OFFICE VISIT NOTE  History:   Cheyenne Hahn is a 33 y.o. [id number] here today for Annual Well Woman visit. Patient reports no new reproductive changes. She endorses that her sexual partner, recently informed her that he was positive for chlamydia and she would like to be tested. She also reports that she recently noted that her IUD strings have gotten longer and have been poking her. She states that she tried to tuck them, but has been unsuccessful.  She denies any abnormal vaginal discharge, bleeding, pelvic pain or other concerns.     Past Medical History:  Diagnosis Date   Anemia    Anxiety    Depression    Pregnancy induced hypertension     Past Surgical History:  Procedure Laterality Date   WISDOM TOOTH EXTRACTION      The following portions of the patient's history were reviewed and updated as appropriate: allergies, current medications, past family history, past medical history, past social history, past surgical history and problem list.   Health Maintenance:  Normal pap and negative HRHPV on 01/01/24.    Review of Systems:  Pertinent items noted in HPI and remainder of comprehensive ROS otherwise negative.  Physical Exam:  BP 121/78   Pulse 65   Ht 5' 3 (1.6 m)   Wt 131 lb 14.4 oz (59.8 kg)   LMP 03/19/2024 (Exact Date)   BMI 23.37 kg/m  CONSTITUTIONAL: Well-developed, well-nourished female in no acute distress.  HEENT:  Normocephalic, atraumatic. External right and left ear normal. No scleral icterus.  NECK: Normal range of motion, supple, no masses noted on observation SKIN: No rash noted. Not diaphoretic. No erythema. No pallor. MUSCULOSKELETAL: Normal range of motion. No edema noted. NEUROLOGIC: Alert and oriented to person, place, and time. Normal muscle tone coordination. No cranial nerve deficit noted. PSYCHIATRIC: Normal mood and affect. Normal behavior. Normal judgment and thought content. CARDIOVASCULAR: Normal heart rate  noted RESPIRATORY: Effort and breath sounds normal, no problems with respiration noted ABDOMEN: No masses noted. No other overt distention noted.   PELVIC: Normal appearing external genitalia; normal urethral meatus; normal appearing vaginal mucosa and cervix.  No abnormal discharge noted.  IUD and IUD stings visualized on exam today. The base of the IUD was ~1-70mm outside of the cervix. Performed in the presence of a chaperone. CANDIE Shed, CMA   Labs and Imaging No results found for this or any previous visit (from the past week). No results found.    Assessment and Plan:    1. Well woman exam (Primary) - Patient overall doing well.  - Cytology - PAP( [facility]) - Cervicovaginal ancillary only( [facility]) - Ambulatory referral to [facility]  2. Birth control counseling - Patient does not desire any more children and would like to continue use of IUD.   3. Exposure to chlamydia - STD testing performed at patient request   4. IUD migration, initial encounter - IUD had migrated to LUS and partially outside of cervix. CNM attempted to grasp and gently removed but was unsuccessful x2.  - Dr. Abigail called to bedside to assess removal. Dr. Abigail was able to grasp neck of IUD with ring forceps and remove IUD completely with minimal difficulty. Device intact.    5. Encounter for IUD removal and reinsertion - Please see Procedure note for remaining details  - IUD replaced successfully on today's visit.   Routine preventative health maintenance measures emphasized. Please refer to After Visit Summary for other counseling  recommendations.   Return in about 1 year (around 03/30/2025) for [facility].    I spent 30 minutes dedicated to the care of this patient including pre-visit review of records, face to face time with the patient discussing her conditions and treatments and post visit orders.   Judi Jaffe Erven) Emilio, MSN, CNM  [facility]  03/30/24  12:21 PM

## 2024-03-31 LAB — CERVICOVAGINAL ANCILLARY ONLY
Bacterial Vaginitis (gardnerella): POSITIVE — AB
Candida Glabrata: NEGATIVE
Candida Vaginitis: NEGATIVE
Chlamydia: POSITIVE — AB
Comment: NEGATIVE
Comment: NEGATIVE
Comment: NEGATIVE
Comment: NEGATIVE
Comment: NEGATIVE
Comment: NORMAL
Neisseria Gonorrhea: NEGATIVE
Trichomonas: POSITIVE — AB

## 2024-04-04 ENCOUNTER — Ambulatory Visit: Payer: Self-pay | Admitting: Certified Nurse Midwife

## 2024-04-04 DIAGNOSIS — Z202 Contact with and (suspected) exposure to infections with a predominantly sexual mode of transmission: Secondary | ICD-10-CM

## 2024-04-04 DIAGNOSIS — A599 Trichomoniasis, unspecified: Secondary | ICD-10-CM

## 2024-04-04 MED ORDER — AZITHROMYCIN 500 MG PO TABS: 1000.0000 mg | ORAL_TABLET | Freq: Once | ORAL | 0 refills | Status: AC

## 2024-04-04 MED ORDER — TINIDAZOLE 500 MG PO TABS: 2.0000 g | ORAL_TABLET | Freq: Once | ORAL | 0 refills | Status: AC

## 2024-04-11 LAB — CYTOLOGY - PAP
Comment: NEGATIVE
Comment: NEGATIVE
Comment: NEGATIVE
Diagnosis: NEGATIVE
HPV 16: NEGATIVE
HPV 18 / 45: NEGATIVE
High risk HPV: POSITIVE — AB
